# Patient Record
Sex: Female | Born: 1975
Health system: Southern US, Community
[De-identification: ages and names within clinical notes are randomized; demographics above are authoritative.]

## PROBLEM LIST (undated history)

## (undated) DIAGNOSIS — G43909 Migraine, unspecified, not intractable, without status migrainosus: Secondary | ICD-10-CM

## (undated) DIAGNOSIS — G459 Transient cerebral ischemic attack, unspecified: Secondary | ICD-10-CM

## (undated) DIAGNOSIS — E785 Hyperlipidemia, unspecified: Secondary | ICD-10-CM

## (undated) DIAGNOSIS — G8929 Other chronic pain: Secondary | ICD-10-CM

## (undated) DIAGNOSIS — J449 Chronic obstructive pulmonary disease, unspecified: Secondary | ICD-10-CM

## (undated) DIAGNOSIS — K219 Gastro-esophageal reflux disease without esophagitis: Secondary | ICD-10-CM

## (undated) DIAGNOSIS — J310 Chronic rhinitis: Secondary | ICD-10-CM

## (undated) DIAGNOSIS — L309 Dermatitis, unspecified: Secondary | ICD-10-CM

## (undated) DIAGNOSIS — D649 Anemia, unspecified: Secondary | ICD-10-CM

## (undated) DIAGNOSIS — E049 Nontoxic goiter, unspecified: Secondary | ICD-10-CM

## (undated) HISTORY — DX: Gastro-esophageal reflux disease without esophagitis: K21.9

## (undated) HISTORY — DX: Anemia, unspecified: D64.9

## (undated) HISTORY — DX: Transient cerebral ischemic attack, unspecified: G45.9

## (undated) HISTORY — DX: Dermatitis, unspecified: L30.9

## (undated) HISTORY — DX: Chronic rhinitis: J31.0

## (undated) HISTORY — DX: Nontoxic goiter, unspecified: E04.9

## (undated) HISTORY — DX: Chronic obstructive pulmonary disease, unspecified: J44.9

## (undated) HISTORY — DX: Hyperlipidemia, unspecified: E78.5

## (undated) HISTORY — PX: TUBAL LIGATION: SHX77

---

## 1999-11-16 ENCOUNTER — Emergency Department (HOSPITAL_COMMUNITY): Admission: EM | Admit: 1999-11-16 | Discharge: 1999-11-16 | Payer: Self-pay | Admitting: Emergency Medicine

## 2000-02-05 ENCOUNTER — Emergency Department (HOSPITAL_COMMUNITY): Admission: EM | Admit: 2000-02-05 | Discharge: 2000-02-05 | Payer: Self-pay | Admitting: Emergency Medicine

## 2000-02-16 ENCOUNTER — Emergency Department (HOSPITAL_COMMUNITY): Admission: EM | Admit: 2000-02-16 | Discharge: 2000-02-17 | Payer: Self-pay | Admitting: Emergency Medicine

## 2000-02-17 ENCOUNTER — Encounter: Payer: Self-pay | Admitting: Emergency Medicine

## 2000-03-07 ENCOUNTER — Ambulatory Visit (HOSPITAL_COMMUNITY): Admission: EM | Admit: 2000-03-07 | Discharge: 2000-03-07 | Payer: Self-pay | Admitting: Emergency Medicine

## 2000-03-07 ENCOUNTER — Encounter (INDEPENDENT_AMBULATORY_CARE_PROVIDER_SITE_OTHER): Payer: Self-pay | Admitting: Specialist

## 2000-03-07 ENCOUNTER — Encounter: Payer: Self-pay | Admitting: Emergency Medicine

## 2000-06-18 ENCOUNTER — Encounter: Payer: Self-pay | Admitting: Emergency Medicine

## 2000-06-18 ENCOUNTER — Emergency Department (HOSPITAL_COMMUNITY): Admission: EM | Admit: 2000-06-18 | Discharge: 2000-06-18 | Payer: Self-pay | Admitting: Emergency Medicine

## 2001-02-04 ENCOUNTER — Emergency Department (HOSPITAL_COMMUNITY): Admission: EM | Admit: 2001-02-04 | Discharge: 2001-02-04 | Payer: Self-pay | Admitting: Emergency Medicine

## 2001-02-17 ENCOUNTER — Inpatient Hospital Stay (HOSPITAL_COMMUNITY): Admission: AD | Admit: 2001-02-17 | Discharge: 2001-02-17 | Payer: Self-pay | Admitting: Obstetrics & Gynecology

## 2001-03-20 ENCOUNTER — Emergency Department (HOSPITAL_COMMUNITY): Admission: EM | Admit: 2001-03-20 | Discharge: 2001-03-20 | Payer: Self-pay | Admitting: Emergency Medicine

## 2001-08-30 ENCOUNTER — Inpatient Hospital Stay (HOSPITAL_COMMUNITY): Admission: AD | Admit: 2001-08-30 | Discharge: 2001-08-30 | Payer: Self-pay | Admitting: Obstetrics and Gynecology

## 2001-08-31 ENCOUNTER — Encounter (INDEPENDENT_AMBULATORY_CARE_PROVIDER_SITE_OTHER): Payer: Self-pay | Admitting: Specialist

## 2001-08-31 ENCOUNTER — Inpatient Hospital Stay (HOSPITAL_COMMUNITY): Admission: AD | Admit: 2001-08-31 | Discharge: 2001-09-03 | Payer: Self-pay | Admitting: Obstetrics and Gynecology

## 2001-10-07 ENCOUNTER — Other Ambulatory Visit: Admission: RE | Admit: 2001-10-07 | Discharge: 2001-10-07 | Payer: Self-pay | Admitting: Obstetrics and Gynecology

## 2002-02-15 ENCOUNTER — Inpatient Hospital Stay (HOSPITAL_COMMUNITY): Admission: AD | Admit: 2002-02-15 | Discharge: 2002-02-15 | Payer: Self-pay | Admitting: *Deleted

## 2002-12-05 ENCOUNTER — Emergency Department (HOSPITAL_COMMUNITY): Admission: EM | Admit: 2002-12-05 | Discharge: 2002-12-05 | Payer: Self-pay | Admitting: Emergency Medicine

## 2003-05-03 ENCOUNTER — Emergency Department (HOSPITAL_COMMUNITY): Admission: EM | Admit: 2003-05-03 | Discharge: 2003-05-03 | Payer: Self-pay | Admitting: *Deleted

## 2003-08-04 ENCOUNTER — Emergency Department (HOSPITAL_COMMUNITY): Admission: EM | Admit: 2003-08-04 | Discharge: 2003-08-04 | Payer: Self-pay | Admitting: Emergency Medicine

## 2003-08-05 ENCOUNTER — Inpatient Hospital Stay (HOSPITAL_COMMUNITY): Admission: AD | Admit: 2003-08-05 | Discharge: 2003-08-05 | Payer: Self-pay | Admitting: Obstetrics & Gynecology

## 2003-08-10 ENCOUNTER — Encounter: Admission: RE | Admit: 2003-08-10 | Discharge: 2003-08-10 | Payer: Self-pay | Admitting: *Deleted

## 2003-08-19 ENCOUNTER — Encounter: Admission: RE | Admit: 2003-08-19 | Discharge: 2003-08-19 | Payer: Self-pay | Admitting: *Deleted

## 2003-08-30 ENCOUNTER — Inpatient Hospital Stay (HOSPITAL_COMMUNITY): Admission: AD | Admit: 2003-08-30 | Discharge: 2003-08-30 | Payer: Self-pay | Admitting: Obstetrics and Gynecology

## 2003-08-31 ENCOUNTER — Encounter: Admission: RE | Admit: 2003-08-31 | Discharge: 2003-08-31 | Payer: Self-pay | Admitting: *Deleted

## 2003-09-15 ENCOUNTER — Encounter: Admission: RE | Admit: 2003-09-15 | Discharge: 2003-09-15 | Payer: Self-pay | Admitting: *Deleted

## 2003-09-28 ENCOUNTER — Other Ambulatory Visit: Admission: RE | Admit: 2003-09-28 | Discharge: 2003-09-28 | Payer: Self-pay | Admitting: Obstetrics and Gynecology

## 2003-10-14 ENCOUNTER — Inpatient Hospital Stay (HOSPITAL_COMMUNITY): Admission: AD | Admit: 2003-10-14 | Discharge: 2003-10-15 | Payer: Self-pay | Admitting: *Deleted

## 2003-11-11 ENCOUNTER — Encounter: Admission: RE | Admit: 2003-11-11 | Discharge: 2003-11-11 | Payer: Self-pay | Admitting: Obstetrics and Gynecology

## 2003-11-12 ENCOUNTER — Inpatient Hospital Stay (HOSPITAL_COMMUNITY): Admission: AD | Admit: 2003-11-12 | Discharge: 2003-11-12 | Payer: Self-pay | Admitting: Obstetrics & Gynecology

## 2003-11-18 ENCOUNTER — Encounter: Admission: RE | Admit: 2003-11-18 | Discharge: 2003-11-18 | Payer: Self-pay | Admitting: Obstetrics and Gynecology

## 2003-11-24 ENCOUNTER — Inpatient Hospital Stay (HOSPITAL_COMMUNITY): Admission: AD | Admit: 2003-11-24 | Discharge: 2003-11-24 | Payer: Self-pay | Admitting: Obstetrics and Gynecology

## 2003-11-24 ENCOUNTER — Encounter: Admission: RE | Admit: 2003-11-24 | Discharge: 2003-11-24 | Payer: Self-pay | Admitting: *Deleted

## 2003-12-01 ENCOUNTER — Encounter: Admission: RE | Admit: 2003-12-01 | Discharge: 2003-12-01 | Payer: Self-pay | Admitting: Obstetrics and Gynecology

## 2003-12-07 ENCOUNTER — Inpatient Hospital Stay (HOSPITAL_COMMUNITY): Admission: RE | Admit: 2003-12-07 | Discharge: 2003-12-11 | Payer: Self-pay | Admitting: Obstetrics and Gynecology

## 2003-12-07 ENCOUNTER — Encounter (INDEPENDENT_AMBULATORY_CARE_PROVIDER_SITE_OTHER): Payer: Self-pay | Admitting: Specialist

## 2003-12-12 ENCOUNTER — Encounter: Admission: RE | Admit: 2003-12-12 | Discharge: 2004-01-11 | Payer: Self-pay | Admitting: Obstetrics and Gynecology

## 2003-12-31 ENCOUNTER — Other Ambulatory Visit: Admission: RE | Admit: 2003-12-31 | Discharge: 2003-12-31 | Payer: Self-pay | Admitting: Obstetrics and Gynecology

## 2004-01-12 ENCOUNTER — Encounter: Admission: RE | Admit: 2004-01-12 | Discharge: 2004-02-11 | Payer: Self-pay | Admitting: Obstetrics and Gynecology

## 2004-03-13 ENCOUNTER — Encounter: Admission: RE | Admit: 2004-03-13 | Discharge: 2004-04-12 | Payer: Self-pay | Admitting: Obstetrics and Gynecology

## 2004-05-13 ENCOUNTER — Encounter: Admission: RE | Admit: 2004-05-13 | Discharge: 2004-06-12 | Payer: Self-pay | Admitting: Obstetrics and Gynecology

## 2004-06-13 ENCOUNTER — Encounter: Admission: RE | Admit: 2004-06-13 | Discharge: 2004-07-13 | Payer: Self-pay | Admitting: Obstetrics and Gynecology

## 2004-08-13 ENCOUNTER — Encounter: Admission: RE | Admit: 2004-08-13 | Discharge: 2004-09-12 | Payer: Self-pay | Admitting: Obstetrics and Gynecology

## 2005-04-15 ENCOUNTER — Emergency Department (HOSPITAL_COMMUNITY): Admission: EM | Admit: 2005-04-15 | Discharge: 2005-04-15 | Payer: Self-pay | Admitting: Emergency Medicine

## 2005-05-03 ENCOUNTER — Emergency Department (HOSPITAL_COMMUNITY): Admission: EM | Admit: 2005-05-03 | Discharge: 2005-05-03 | Payer: Self-pay | Admitting: Emergency Medicine

## 2005-07-07 ENCOUNTER — Emergency Department (HOSPITAL_COMMUNITY): Admission: EM | Admit: 2005-07-07 | Discharge: 2005-07-08 | Payer: Self-pay | Admitting: Emergency Medicine

## 2005-07-15 ENCOUNTER — Emergency Department (HOSPITAL_COMMUNITY): Admission: EM | Admit: 2005-07-15 | Discharge: 2005-07-15 | Payer: Self-pay | Admitting: Emergency Medicine

## 2005-11-10 ENCOUNTER — Emergency Department (HOSPITAL_COMMUNITY): Admission: EM | Admit: 2005-11-10 | Discharge: 2005-11-10 | Payer: Self-pay | Admitting: Emergency Medicine

## 2005-12-21 ENCOUNTER — Emergency Department (HOSPITAL_COMMUNITY): Admission: EM | Admit: 2005-12-21 | Discharge: 2005-12-21 | Payer: Self-pay | Admitting: Emergency Medicine

## 2006-04-05 ENCOUNTER — Emergency Department (HOSPITAL_COMMUNITY): Admission: EM | Admit: 2006-04-05 | Discharge: 2006-04-05 | Payer: Self-pay | Admitting: Emergency Medicine

## 2007-12-31 ENCOUNTER — Emergency Department (HOSPITAL_COMMUNITY): Admission: EM | Admit: 2007-12-31 | Discharge: 2007-12-31 | Payer: Self-pay | Admitting: Emergency Medicine

## 2008-02-02 ENCOUNTER — Ambulatory Visit: Payer: Self-pay | Admitting: Internal Medicine

## 2008-02-02 DIAGNOSIS — J45909 Unspecified asthma, uncomplicated: Secondary | ICD-10-CM

## 2008-02-02 DIAGNOSIS — J4530 Mild persistent asthma, uncomplicated: Secondary | ICD-10-CM | POA: Insufficient documentation

## 2008-03-12 ENCOUNTER — Telehealth (INDEPENDENT_AMBULATORY_CARE_PROVIDER_SITE_OTHER): Payer: Self-pay | Admitting: *Deleted

## 2008-03-16 ENCOUNTER — Telehealth: Payer: Self-pay | Admitting: Internal Medicine

## 2008-04-08 ENCOUNTER — Ambulatory Visit: Payer: Self-pay | Admitting: Internal Medicine

## 2008-04-08 DIAGNOSIS — J019 Acute sinusitis, unspecified: Secondary | ICD-10-CM | POA: Insufficient documentation

## 2008-04-09 ENCOUNTER — Telehealth (INDEPENDENT_AMBULATORY_CARE_PROVIDER_SITE_OTHER): Payer: Self-pay | Admitting: *Deleted

## 2008-04-13 ENCOUNTER — Telehealth (INDEPENDENT_AMBULATORY_CARE_PROVIDER_SITE_OTHER): Payer: Self-pay | Admitting: *Deleted

## 2008-04-23 ENCOUNTER — Telehealth (INDEPENDENT_AMBULATORY_CARE_PROVIDER_SITE_OTHER): Payer: Self-pay | Admitting: *Deleted

## 2008-04-29 ENCOUNTER — Ambulatory Visit: Payer: Self-pay | Admitting: Internal Medicine

## 2008-04-29 DIAGNOSIS — J310 Chronic rhinitis: Secondary | ICD-10-CM | POA: Insufficient documentation

## 2008-05-03 ENCOUNTER — Emergency Department (HOSPITAL_COMMUNITY): Admission: EM | Admit: 2008-05-03 | Discharge: 2008-05-03 | Payer: Self-pay | Admitting: Emergency Medicine

## 2008-05-03 ENCOUNTER — Encounter: Payer: Self-pay | Admitting: Internal Medicine

## 2008-05-03 LAB — CONVERTED CEMR LAB
AST: 24 units/L
Alkaline Phosphatase: 54 units/L
CO2: 24 meq/L
Neutrophils Relative %: 70 %
Platelets: 374 10*3/uL
RBC: 4.89 M/uL
RDW: 15.6 %
Total Bilirubin: 0.9 mg/dL

## 2008-05-27 ENCOUNTER — Encounter: Payer: Self-pay | Admitting: Internal Medicine

## 2008-05-28 ENCOUNTER — Emergency Department (HOSPITAL_COMMUNITY): Admission: EM | Admit: 2008-05-28 | Discharge: 2008-05-28 | Payer: Self-pay | Admitting: Emergency Medicine

## 2008-05-28 ENCOUNTER — Encounter: Payer: Self-pay | Admitting: Internal Medicine

## 2008-05-28 LAB — CONVERTED CEMR LAB
Albumin: 3.6 g/dL
Alkaline Phosphatase: 58 units/L
BUN: 9 mg/dL
Chloride: 103 meq/L
Creatinine, Ser: 0.72 mg/dL
Eosinophils Relative: 1 %
HCT: 36 %
RBC: 4.73 M/uL
RDW: 15.2 %
Total Bilirubin: 0.6 mg/dL
Total Protein: 7.2 g/dL

## 2008-06-02 ENCOUNTER — Ambulatory Visit: Payer: Self-pay | Admitting: Gastroenterology

## 2008-06-02 DIAGNOSIS — R1013 Epigastric pain: Secondary | ICD-10-CM | POA: Insufficient documentation

## 2008-06-02 DIAGNOSIS — D649 Anemia, unspecified: Secondary | ICD-10-CM | POA: Insufficient documentation

## 2008-06-02 DIAGNOSIS — J449 Chronic obstructive pulmonary disease, unspecified: Secondary | ICD-10-CM | POA: Insufficient documentation

## 2008-06-02 DIAGNOSIS — J4489 Other specified chronic obstructive pulmonary disease: Secondary | ICD-10-CM | POA: Insufficient documentation

## 2008-06-04 ENCOUNTER — Encounter: Payer: Self-pay | Admitting: Internal Medicine

## 2008-06-04 ENCOUNTER — Ambulatory Visit (HOSPITAL_COMMUNITY): Admission: RE | Admit: 2008-06-04 | Discharge: 2008-06-04 | Payer: Self-pay | Admitting: Gastroenterology

## 2008-06-21 ENCOUNTER — Ambulatory Visit: Payer: Self-pay | Admitting: Gastroenterology

## 2008-06-22 ENCOUNTER — Telehealth: Payer: Self-pay | Admitting: Gastroenterology

## 2008-06-24 ENCOUNTER — Telehealth: Payer: Self-pay | Admitting: Gastroenterology

## 2008-06-24 ENCOUNTER — Ambulatory Visit: Payer: Self-pay | Admitting: Cardiology

## 2008-06-28 ENCOUNTER — Encounter: Payer: Self-pay | Admitting: Gastroenterology

## 2008-06-28 ENCOUNTER — Telehealth: Payer: Self-pay | Admitting: Gastroenterology

## 2008-06-29 ENCOUNTER — Ambulatory Visit: Payer: Self-pay | Admitting: Gastroenterology

## 2008-06-29 LAB — CONVERTED CEMR LAB
Hemoglobin, Urine: NEGATIVE
RBC / HPF: NONE SEEN
Specific Gravity, Urine: 1.025 (ref 1.000–1.03)
Urobilinogen, UA: 0.2 (ref 0.0–1.0)

## 2008-07-01 ENCOUNTER — Encounter: Payer: Self-pay | Admitting: Gastroenterology

## 2008-07-01 ENCOUNTER — Ambulatory Visit: Payer: Self-pay | Admitting: Gastroenterology

## 2008-07-06 ENCOUNTER — Encounter: Payer: Self-pay | Admitting: Gastroenterology

## 2008-07-06 ENCOUNTER — Ambulatory Visit: Payer: Self-pay | Admitting: Internal Medicine

## 2008-07-06 ENCOUNTER — Telehealth (INDEPENDENT_AMBULATORY_CARE_PROVIDER_SITE_OTHER): Payer: Self-pay | Admitting: *Deleted

## 2008-08-13 ENCOUNTER — Telehealth (INDEPENDENT_AMBULATORY_CARE_PROVIDER_SITE_OTHER): Payer: Self-pay | Admitting: *Deleted

## 2009-01-25 IMAGING — CT CT PELVIS W/ CM
2 of 7 series · 16 of 46 positions shown, 18 images · IV contrast (Omnipaque 300)
Comparison: Ultrasound of the abdomen of 06/04/2008

CT ABDOMEN

CLINICAL DATA: Abdominal pain

CT ABDOMEN AND PELVIS WITH CONTRAST
TECHNIQUE: Multidetector CT imaging of the abdomen and pelvis was
performed using the standard protocol following bolus
administration of intravenous contrast.
Contrast: 100 ml Xmnipaque-F44

[Series 2: abd_pel 5.0 b30f st · axial · 0.70mm/px · z∈[-423,-63]mm · 13 of 85 slices shown, 15 images]
[im 7/85  soft-tissue]
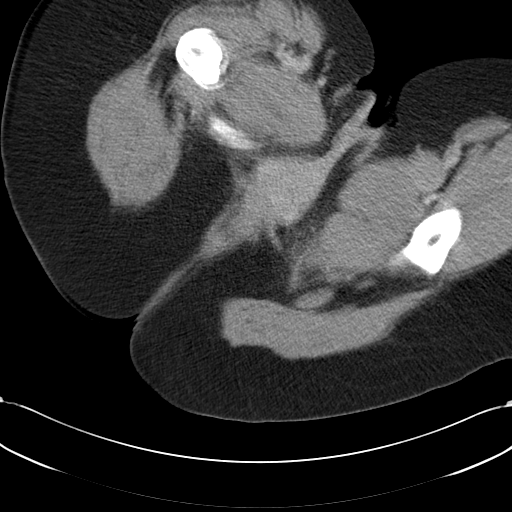
[im 7/85  bone]
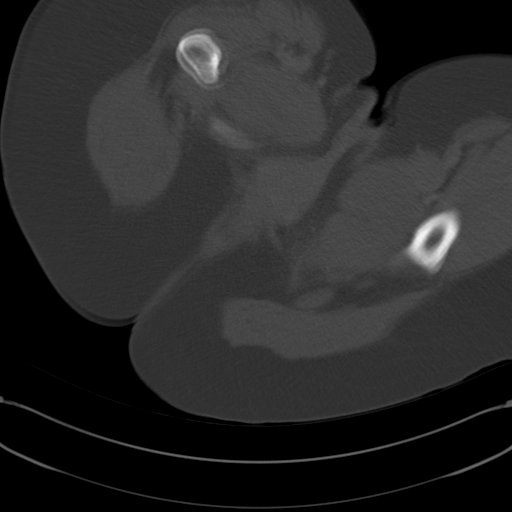
[im 13/85  soft-tissue]
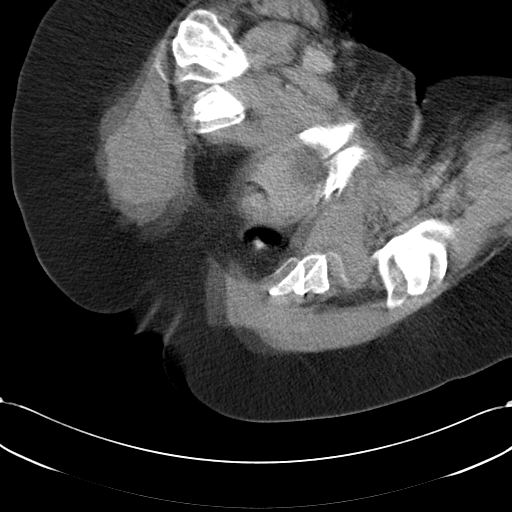
[im 19/85  soft-tissue]
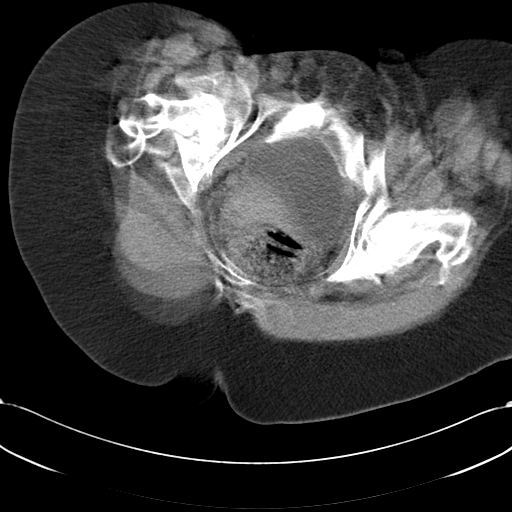
[im 25/85  soft-tissue]
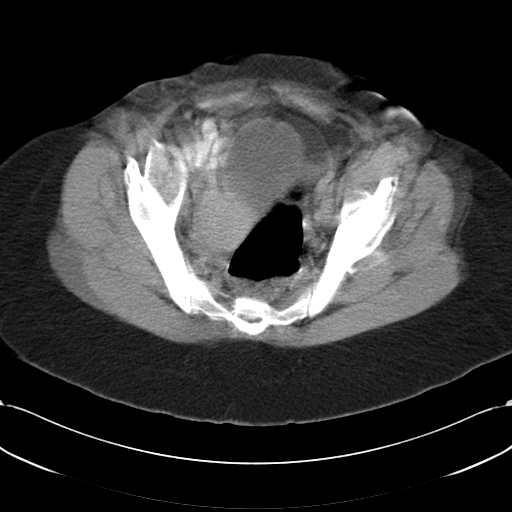
[im 31/85  soft-tissue]
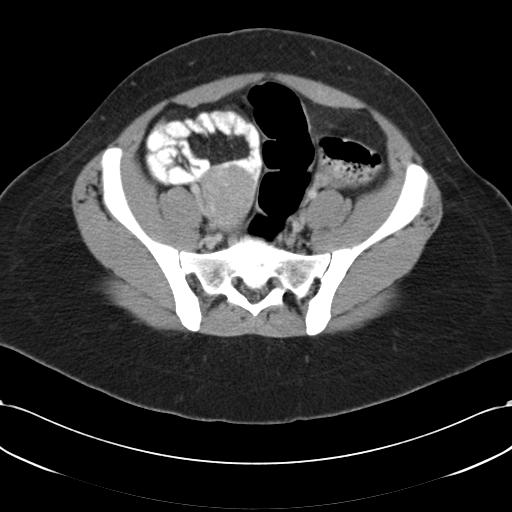
[im 37/85  soft-tissue]
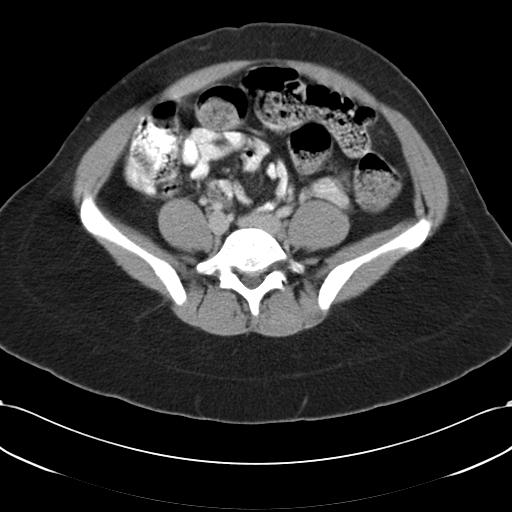
[im 43/85  soft-tissue]
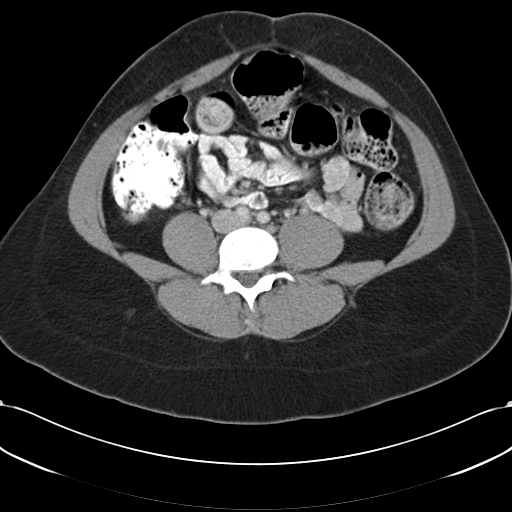
[im 49/85  soft-tissue]
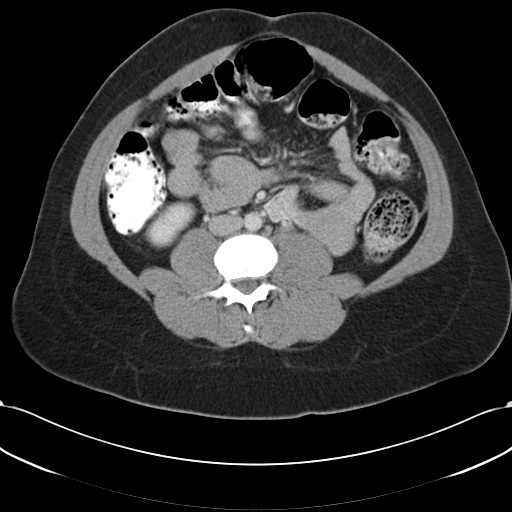
[im 55/85  soft-tissue]
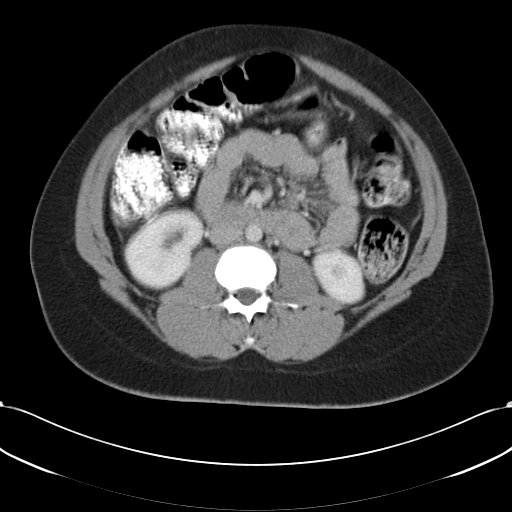
[im 55/85  bone]
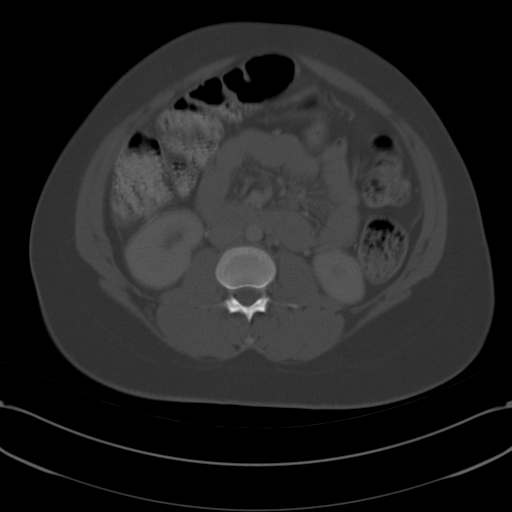
[im 61/85  soft-tissue]
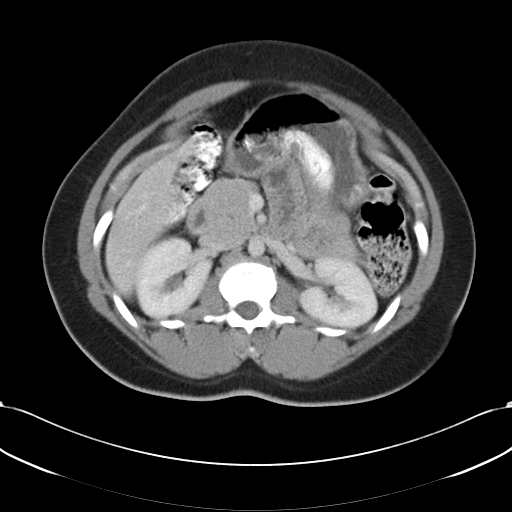
[im 67/85  soft-tissue]
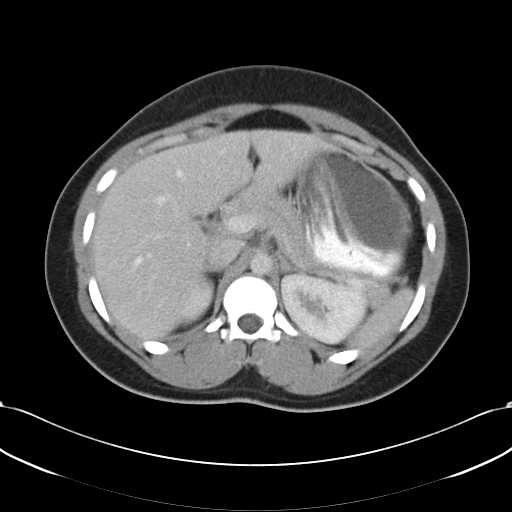
[im 73/85  soft-tissue]
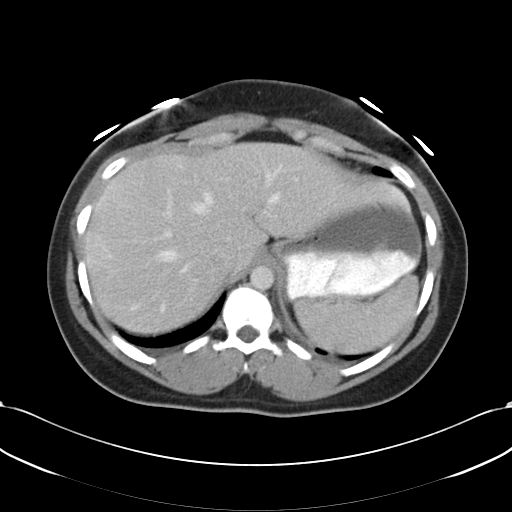
[im 79/85  soft-tissue]
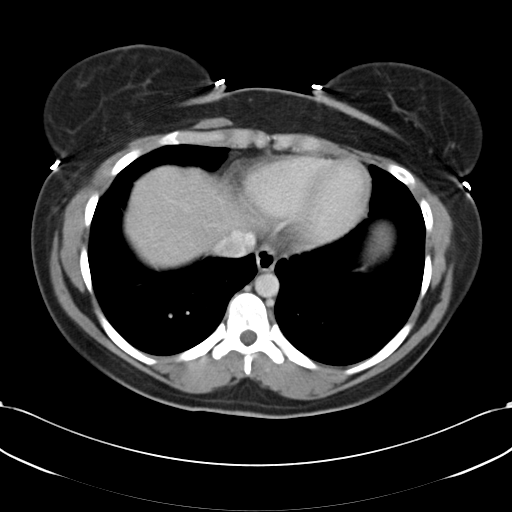

[Series 602: <mpr thick range> · coronal · 0.86mm/px · 3 of 89 slices shown]
[im 23/89  soft-tissue]
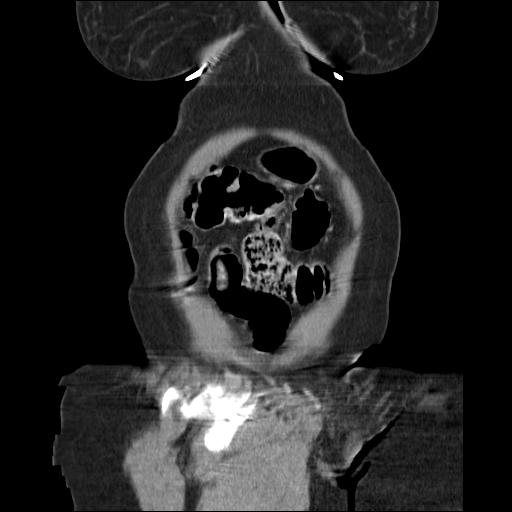
[im 45/89  soft-tissue]
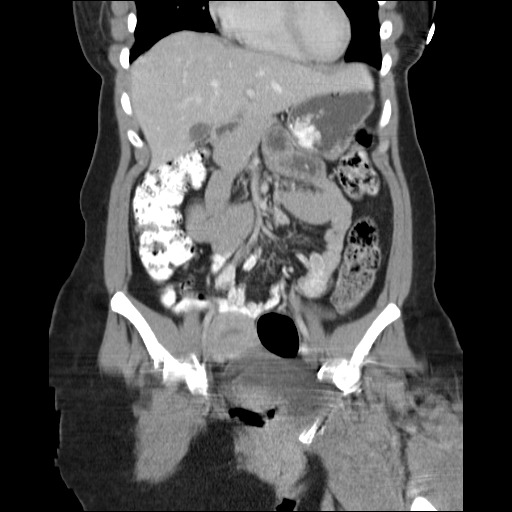
[im 67/89  soft-tissue]
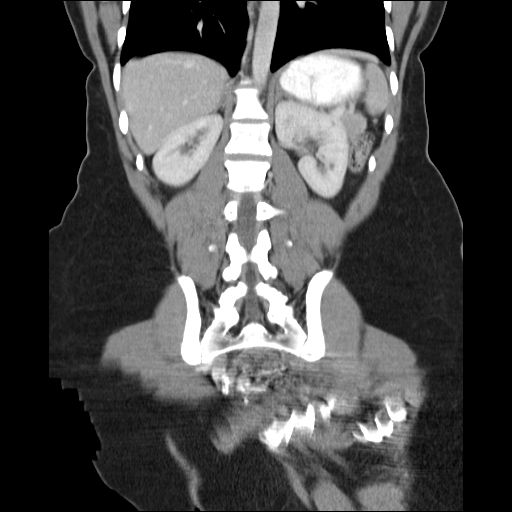

[16 of 46 positions shown; findings below may reference images not displayed]

FINDINGS: The lung bases are clear.  The liver enhances with no
focal abnormality and no ductal dilatation is seen.  No calcified
gallstones are noted.  The pancreas is within normal limits in size
with no mass and no ductal dilatation evident.  The adrenal glands
and spleen appear normal.  The kidneys enhance and on delayed
images the pelvocaliceal systems appear normal.  The abdominal
aorta is normal in caliber.  No adenopathy is seen.
IMPRESSION: No significant abnormality on CT of the abdomen.

CT PELVIS
FINDINGS: There is some free fluid within the right lower
quadrant.  Unfortunately the patient moved on the original portal
venous phase images and the pelvis is largely obscured by motion
artifact.  Delayed images through the pelvis were obtained.  There
is fluid within the right lower quadrant.  The appendix is well
seen and does fill with air.  There is no CT evidence of
appendicitis.  The terminal ileum is well visualized and appears
normal.  However, there do appear to be bilaterally collapsing
ovarian cysts present which may account for the fluid within the
right lower quadrant and  cul-de-sac.  The uterus is normal in
size.  The urinary bladder is unremarkable.  No colonic abnormality
is seen.  No bony abnormality is noted.
IMPRESSION: 1.  There is some free fluid within the right lower quadrant and
right adnexa probably due to  collapsing ovarian cysts as noted
above.  The appendix is filled with air and there is no CT evidence
currently of acute appendicitis.
2.  The terminal ileum is well visualized and appears normal.

## 2009-05-12 ENCOUNTER — Ambulatory Visit: Payer: Self-pay | Admitting: Internal Medicine

## 2009-06-14 ENCOUNTER — Emergency Department (HOSPITAL_COMMUNITY): Admission: EM | Admit: 2009-06-14 | Discharge: 2009-06-14 | Payer: Self-pay | Admitting: Emergency Medicine

## 2009-06-15 ENCOUNTER — Telehealth (INDEPENDENT_AMBULATORY_CARE_PROVIDER_SITE_OTHER): Payer: Self-pay | Admitting: *Deleted

## 2009-06-20 ENCOUNTER — Encounter: Payer: Self-pay | Admitting: Internal Medicine

## 2009-07-19 ENCOUNTER — Telehealth (INDEPENDENT_AMBULATORY_CARE_PROVIDER_SITE_OTHER): Payer: Self-pay | Admitting: *Deleted

## 2009-07-21 ENCOUNTER — Telehealth (INDEPENDENT_AMBULATORY_CARE_PROVIDER_SITE_OTHER): Payer: Self-pay | Admitting: *Deleted

## 2009-11-02 ENCOUNTER — Emergency Department (HOSPITAL_COMMUNITY): Admission: EM | Admit: 2009-11-02 | Discharge: 2009-11-02 | Payer: Self-pay | Admitting: Emergency Medicine

## 2010-01-25 ENCOUNTER — Emergency Department (HOSPITAL_COMMUNITY): Admission: EM | Admit: 2010-01-25 | Discharge: 2010-01-25 | Payer: Self-pay | Admitting: Emergency Medicine

## 2010-04-28 ENCOUNTER — Encounter: Payer: Self-pay | Admitting: Internal Medicine

## 2010-04-28 DIAGNOSIS — Z8672 Personal history of thrombophlebitis: Secondary | ICD-10-CM | POA: Insufficient documentation

## 2010-05-02 ENCOUNTER — Ambulatory Visit: Payer: Self-pay | Admitting: Internal Medicine

## 2010-05-02 DIAGNOSIS — E049 Nontoxic goiter, unspecified: Secondary | ICD-10-CM | POA: Insufficient documentation

## 2010-05-02 DIAGNOSIS — Z87891 Personal history of nicotine dependence: Secondary | ICD-10-CM | POA: Insufficient documentation

## 2010-05-02 DIAGNOSIS — G43909 Migraine, unspecified, not intractable, without status migrainosus: Secondary | ICD-10-CM | POA: Insufficient documentation

## 2010-05-02 DIAGNOSIS — M25569 Pain in unspecified knee: Secondary | ICD-10-CM | POA: Insufficient documentation

## 2010-05-02 DIAGNOSIS — K219 Gastro-esophageal reflux disease without esophagitis: Secondary | ICD-10-CM | POA: Insufficient documentation

## 2010-05-02 DIAGNOSIS — B359 Dermatophytosis, unspecified: Secondary | ICD-10-CM | POA: Insufficient documentation

## 2010-05-02 LAB — CONVERTED CEMR LAB
Alkaline Phosphatase: 64 units/L (ref 39–117)
Basophils Relative: 0.3 % (ref 0.0–3.0)
Bilirubin, Direct: 0.1 mg/dL (ref 0.0–0.3)
CO2: 28 meq/L (ref 19–32)
Calcium: 9.1 mg/dL (ref 8.4–10.5)
Creatinine, Ser: 0.7 mg/dL (ref 0.4–1.2)
Eosinophils Absolute: 0.4 10*3/uL (ref 0.0–0.7)
HDL: 58.5 mg/dL (ref >39.00)
LDL Cholesterol: 120 mg/dL — ABNORMAL HIGH (ref 0–99)
Lymphocytes Relative: 20.6 % (ref 12.0–46.0)
MCHC: 33.3 g/dL (ref 30.0–36.0)
Neutrophils Relative %: 70 % (ref 43.0–77.0)
Nitrite: NEGATIVE
Platelets: 413 10*3/uL — ABNORMAL HIGH (ref 150.0–400.0)
RBC: 4.4 M/uL (ref 3.87–5.11)
Specific Gravity, Urine: 1.025 (ref 1.000–1.030)
Total Bilirubin: 0.3 mg/dL (ref 0.3–1.2)
Total CHOL/HDL Ratio: 3
Total Protein, Urine: NEGATIVE mg/dL
Total Protein: 7.2 g/dL (ref 6.0–8.3)
Triglycerides: 109 mg/dL (ref 0.0–149.0)
Urine Glucose: NEGATIVE mg/dL
WBC: 10.3 10*3/uL (ref 4.5–10.5)
pH: 6 (ref 5.0–8.0)

## 2010-05-09 ENCOUNTER — Encounter: Admission: RE | Admit: 2010-05-09 | Discharge: 2010-05-09 | Payer: Self-pay | Admitting: Internal Medicine

## 2010-07-10 ENCOUNTER — Telehealth: Payer: Self-pay | Admitting: Internal Medicine

## 2010-08-17 ENCOUNTER — Ambulatory Visit: Payer: Self-pay | Admitting: Internal Medicine

## 2010-08-17 DIAGNOSIS — R002 Palpitations: Secondary | ICD-10-CM | POA: Insufficient documentation

## 2010-08-17 DIAGNOSIS — E785 Hyperlipidemia, unspecified: Secondary | ICD-10-CM | POA: Insufficient documentation

## 2010-08-17 LAB — CONVERTED CEMR LAB
Basophils Relative: 0.8 % (ref 0.0–3.0)
Eosinophils Relative: 4.7 % (ref 0.0–5.0)
Lymphocytes Relative: 17.8 % (ref 12.0–46.0)
Neutrophils Relative %: 69.6 % (ref 43.0–77.0)
Platelets: 378 10*3/uL (ref 150.0–400.0)
RBC: 4.57 M/uL (ref 3.87–5.11)
TSH: 0.67 microintl units/mL (ref 0.35–5.50)
WBC: 10.5 10*3/uL (ref 4.5–10.5)

## 2010-09-28 ENCOUNTER — Telehealth: Payer: Self-pay | Admitting: Internal Medicine

## 2010-10-17 NOTE — Assessment & Plan Note (Signed)
Summary: New/Bcbs/#/Cd   Vital Signs:  Patient profile:   35 year old female Height:      60 inches (152.40 cm) Weight:      184.4 pounds (83.82 kg) BMI:     36.14 O2 Sat:      98 % on Room air Temp:     97.7 degrees F (36.50 degrees C) oral Pulse rate:   69 / minute BP sitting:   100 / 82  (left arm) Cuff size:   regular  Vitals Entered By: Orlan Leavens RMA (May 02, 2010 1:48 PM)  O2 Flow:  Room air CC: New patient Is Patient Diabetic? No Pain Assessment Patient in pain? no        Primary Care Provider:  Newt Lukes MD  CC:  New patient.  History of Present Illness: new pt to me and our division, here to est care prev followed at urg care for [facility] needs, known to pulm div (wert)  patient is here today for annual physical. Patient feels well overall but numerous concerns:.   c/o knee pain affects bilateral knees at top of patella onset 3 months ago not assoc with swelling, redness or fever pain precipitated by climbing stairs no other joint pains pain improved when taking advil   c/o scalp itch - located 2 area on posterior crown - no scalp lesions or scabs - no new chemicals or topical products - itch improved when using excema cream but pt worried there is "infection under the skin" - +hair loss "becuase i pull it out to try to get to the bugs" also admits to sticking saftey pins into scalp "to try to kill the bugs" "i know i shouldn't do that - i just can't help myself" denies other rash - denies hx psyc illness or counseling -  asthma hx - reports compliance with ongoing medical treatment and no changes in medication dose or frequency. denies adverse side effects related to current therapy.   Preventive Screening-Counseling & Management  Alcohol-Tobacco     Alcohol drinks/day: <1     Alcohol Counseling: not indicated; use of alcohol is not excessive or problematic     Smoking Status: quit     Tobacco Counseling: not to resume use of tobacco  products  Caffeine-Diet-Exercise     Does Patient Exercise: yes     Exercise Counseling: to improve exercise regimen     Depression Counseling: not indicated; screening negative for depression  Safety-Violence-Falls     Seat Belt Counseling: not indicated; patient wears seat belts     Helmet Counseling: not indicated; patient wears helmet when riding bicycle/motocycle     Firearms in the Home: no firearms in the home     Firearm Counseling: not applicable     Violence in the Home: no risk noted     Fall Risk Counseling: not indicated; no significant falls noted  Clinical Review Panels:  Prevention   Last Pap Smear:  Interpretation/ Result:Negative for intraepithelial Lesion or Malignancy.    (09/18/2007)   Last Colonoscopy:  Location:  [facility].  (07/01/2008)  Immunizations   Last Tetanus Booster:  Historical (09/17/2009)  CBC   WBC:  13.3 (05/28/2008)   RBC:  4.73 (05/28/2008)   Hgb:  11.8 (05/28/2008)   Hct:  36.0 (05/28/2008)   Platelets:  360 (05/28/2008)   MCV  76.2 (05/28/2008)   RDW  15.2 (05/28/2008)   PMN:  90 (05/28/2008)   Monos:  3 (05/28/2008)   Eosinophils:  1 (05/28/2008)   Basophil:  0 (05/28/2008)  Complete Metabolic Panel   Glucose:  97 (05/28/2008)   Sodium:  134 (05/28/2008)   Potassium:  3.9 (05/28/2008)   Chloride:  103 (05/28/2008)   CO2:  25 (05/28/2008)   BUN:  9 (05/28/2008)   Creatinine:  0.72 (05/28/2008)   Albumin:  3.6 (05/28/2008)   Total Protein:  7.2 (05/28/2008)   Calcium:  9.3 (05/28/2008)   Total Bili:  0.6 (05/28/2008)   Alk Phos:  58 (05/28/2008)   SGPT (ALT):  18 (05/28/2008)   SGOT (AST):  58 (05/28/2008)   Current Medications (verified): 1)  Ventolin Hfa 108 (90 Base) Mcg/act  Aers (Albuterol Sulfate) .... 2 Puffs Every 4 Hours As Needed 2)  Symbicort 80-4.5 Mcg/act  Aero (Budesonide-Formoterol Fumarate) .... 2 Puffs First Thing  in Am and 2 Puffs Again in Pm About 12 Hours Later  Allergies  (verified): 1)  ! * Contrast Dye  Past History:  Past Medical History: ASTHMA (ICD-493.90)     - HFA 75% May 13, 2009  Anemia-NOS GERD excema  MD roster: pulm - wert gyn - prev phys for women  Past Surgical History: C-Section x 2 Tubal Ligation   Family History: Mother has allergies and asthma, thyroid- Maternal grandmother died of lung ca No FH of Colon Cancer Family History Diabetes 1st degree relative (mom, grandparent, other relative) Family History Hypertension (parent,grandparent, other relative)  Social History: Quit smoking Oct 2008. 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 I have reviewed all other systems and they were negative.   Physical Exam  General:  overweight-appearing.  alert, well-developed, well-nourished, and cooperative to examination.    Eyes:  vision grossly intact; pupils equal, round and reactive to light.  conjunctiva and lids normal.    Ears:  +large skin tag on posterior right pinna; otherwise normal pinnae bilaterally, without erythema, swelling, or tenderness to palpation. TMs clear, without effusion, or cerumen impaction. Hearing grossly normal bilaterally  Mouth:  teeth and gums in good repair; mucous membranes moist, without lesions or ulcers. oropharynx clear without exudate, no erythema.  Neck:  R>L thyromegaly; supple, full ROM, no masses; no thyroid nodules or tenderness. no JVD or carotid bruits.   Lungs:  normal respiratory effort, no intercostal retractions or use of accessory muscles; normal breath sounds bilaterally - no crackles and no wheezes.    Heart:  normal rate, regular rhythm, no murmur, and no rub. BLE without edema. Abdomen:  soft, non-tender, normal bowel sounds, no distention; no masses and no appreciable hepatomegaly or splenomegaly.   Msk:  No  deformity or scoliosis noted of thoracic or lumbar spine.  bilateral knees: full range of motion, no joint effusion or swelling. no erythema or abnormal warmth. Stable to ligamentous testing. Nontender to palpation over joint line but tender along quad insertion. Neurovascularly intact.  Neurologic:  alert & oriented X3 and cranial nerves II-XII symetrically intact.  strength normal in all extremities, sensation intact to light touch, and gait normal. speech fluent without dysarthria or aphasia; follows commands with good comprehension.  Skin:  no rashes, vesicles, ulcers, or erythema. No nodules or irregularity to palpation.  scalp w/o inflammation, alopecia, ulceration or tenderness -  Psych:  Oriented X3, memory intact for recent and remote, normally interactive, good eye  contact, min anxious appearing, not depressed appearing, and not agitated.   appears to have poor judgment when discussing scalp "bugs" (+delusion)   Impression & Recommendations:  Problem # 1:  PREVENTIVE HEALTH CARE (ICD-V70.0) Patient has been counseled on age-appropriate routine health concerns for screening and prevention. These are reviewed and up-to-date. Immunizations are up-to-date or declined. Labs ordered and will be reviewed. Pt to arrange for f/u at gyn for PAP/pelvic Orders: TLB-Lipid Panel (80061-LIPID) TLB-BMP (Basic Metabolic Panel-BMET) (80048-METABOL) TLB-CBC Platelet - w/Differential (85025-CBCD) TLB-Hepatic/Liver Function Pnl (80076-HEPATIC) TLB-TSH (Thyroid Stimulating Hormone) (84443-TSH) TLB-Udip w/ Micro (81001-URINE)  Problem # 2:  DERMATOPHYTOSIS OF UNSPECIFIED SITE (ICD-110.9)  unsure that there is underlying infx as percieved by pt -  reassured pt of same but pt would like to consider bx to prove this - refer to derm also discussed symptoms as manifestation of OCD/delusional disorder -  pt not against psyc dx and tx for same "but i just want it checked out all the way 1st, my family thinks i'm  crazy too" instructed to NOT place saftey pins into scalp - pt agrees  Orders: Dermatology Referral (Derma)  Problem # 3:  GOITER, UNSPECIFIED (ICD-240.9) incidental on exam - labs with cpx abnd refer for [place] to r/o nodule f/u 2-3 mo Orders: Radiology Referral (Radiology)  Problem # 4:  KNEE PAIN, BILATERAL (ICD-719.46)  suscpet tendonitis - tx with meloxicam as needed (relief with advil noted but duration of relief too short) Her updated medication list for this problem includes:    Meloxicam 15 Mg Tabs (Meloxicam) .Marland Kitchen... 1 by mouth once daily as needed for knee pain  Orders: Prescription Created Electronically [phone])  Complete Medication List: 1)  Ventolin Hfa 108 (90 Base) Mcg/act Aers (Albuterol sulfate) .... 2 puffs every 4 hours as needed 2)  Symbicort 80-4.5 Mcg/act Aero (Budesonide-formoterol fumarate) .... 2 puffs first thing  in am and 2 puffs again in pm about 12 hours later 3)  Meloxicam 15 Mg Tabs (Meloxicam) .Marland Kitchen.. 1 by mouth once daily as needed for knee pain  Patient Instructions: 1)  it was good to see you today.  2)  test(s) ordered today - your results will be posted on the phone tree for review in 48-72 hours from the time of test completion; call [phone] and enter your 9 digit MRN (listed above on this page, just below your name); if any changes need to be made or there are abnormal results, you will be contacted directly. 3)  we'll make referral to dermatology for your scalp and ear. Our office will contact you regarding this appointment once made.  4)  also we'll make referral for neck ultrasound to look at thyroid enlargment. Our office will contact you regarding this appointment once made.  5)  use meloxicam for your knee pain as discussed - your prescription has been electronically submitted to your pharmacy. Please take as directed. Contact our office if you believe you're having problems with the medication(s).  6)  You need to have a Pap Smear to  prevent cervical cancer. schedule this appointment with [facility] or other gynecologist as discussed 7)  Please schedule a follow-up appointment in 2-3 months to review ultrasound, call sooner if problems.  Prescriptions: MELOXICAM 15 MG TABS (MELOXICAM) 1 by mouth once daily as needed for knee pain  #30 x 1   Entered and Authorized by:   Newt Lukes MD   Signed by:   Newt Lukes MD on 05/02/2010  Method used:   Electronically to        [facility] (retail)       [address]       [place], [place]  [address]       Ph: [phone]       Fax: [phone]   RxID:   [id number]     Immunization History:  Tetanus/Td Immunization History:    Tetanus/Td:  historical (09/17/2009)    Pap Smear  Procedure date:  09/18/2007  Findings:      Interpretation/ Result:Negative for intraepithelial Lesion or Malignancy.

## 2010-10-17 NOTE — Progress Notes (Signed)
Summary: refill  Phone Note Call from Patient Call back at Home Phone [phone]   Caller: Patient Call For: wert Reason for Call: Refill Medication Summary of Call: Need refill for symbicort 80-4.23mcg, pt has appt on 11/23.//[email or web address] Initial call taken by: Darletta Moll,  July 10, 2010 3:49 PM  Follow-up for Phone Call        Pt verified medication request and pharmacy. Zackery Barefoot CMA  July 10, 2010 3:53 PM     Prescriptions: SYMBICORT 80-4.5 MCG/ACT  AERO (BUDESONIDE-FORMOTEROL FUMARATE) 2 puffs first thing  in am and 2 puffs again in pm about 12 hours later  #1 x 3   Entered by:   Zackery Barefoot CMA   Authorized by:   Nyoka Cowden MD   Signed by:   Zackery Barefoot CMA on 07/10/2010   Method used:   Electronically to        [facility] [address] (retail)       [phone]       [place], [place]  [address]       Ph: [phone]       Fax: [phone]   RxID:   [id number]

## 2010-10-17 NOTE — Assessment & Plan Note (Signed)
Summary: on/off headache/fluttery feeling base of neck/cd   Vital Signs:  Patient profile:   35 year old female Height:      60 inches (152.40 cm) Weight:      186.8 pounds (84.91 kg) O2 Sat:      98 % on Room air Temp:     97.6 degrees F (36.44 degrees C) oral Pulse rate:   79 / minute BP sitting:   102 / 70  (left arm) Cuff size:   regular  Vitals Entered By: Orlan Leavens RMA (August 17, 2010 10:42 AM)  O2 Flow:  Room air CC: Headaches/ Fluttering feeling in base of neck Is Patient Diabetic? No Pain Assessment Patient in pain? no        Primary Care Provider:  Newt Lukes MD  CC:  Headaches/ Fluttering feeling in base of neck.  History of Present Illness: c/o HA, mild onset 3 days ago assoc with fluttering in neck/top of anterior chest - SOB not responsive to inc Ventolin use no cough, no weight change, no sputum or fever - no CP or leg swelling no weakness or vision change no trauma,  +sick contacts no change in meds (except inc alb mdi use)  reviewed chronic med issues c/o scalp itch - located 2 areas on posterior crown - no scalp lesions or scabs - no new chemicals or topical products - itch improved when using excema cream but pt worried there is "infection under the skin" - +hair loss "becuase i pull it out to try to get to the bugs" also admits to sticking saftey pins into scalp "to try to kill the bugs" "i know i shouldn't do that - i just can't help myself" denies other rash - denies hx psyc illness or counseling -  asthma hx - reports compliance with ongoing medical treatment and no changes in medication dose or frequency. denies adverse side effects related to current therapy.   Clinical Review Panels:  Lipid Management   Cholesterol:  200 (05/02/2010)   LDL (bad choesterol):  120 (05/02/2010)   HDL (good cholesterol):  58.50 (05/02/2010)  CBC   WBC:  10.3 (05/02/2010)   RBC:  4.40 (05/02/2010)   Hgb:  11.3 (05/02/2010)   Hct:  33.8  (05/02/2010)   Platelets:  413.0 (05/02/2010)   MCV  76.7 (05/02/2010)   MCHC  33.3 (05/02/2010)   RDW  16.0 (05/02/2010)   PMN:  70.0 (05/02/2010)   Lymphs:  20.6 (05/02/2010)   Monos:  5.6 (05/02/2010)   Eosinophils:  3.5 (05/02/2010)   Basophil:  0.3 (05/02/2010)  Complete Metabolic Panel   Glucose:  83 (05/02/2010)   Sodium:  139 (05/02/2010)   Potassium:  3.8 (05/02/2010)   Chloride:  104 (05/02/2010)   CO2:  28 (05/02/2010)   BUN:  9 (05/02/2010)   Creatinine:  0.7 (05/02/2010)   Albumin:  3.5 (05/02/2010)   Total Protein:  7.2 (05/02/2010)   Calcium:  9.1 (05/02/2010)   Total Bili:  0.3 (05/02/2010)   Alk Phos:  64 (05/02/2010)   SGPT (ALT):  19 (05/02/2010)   SGOT (AST):  18 (05/02/2010)   Current Medications (verified): 1)  Ventolin Hfa 108 (90 Base) Mcg/act  Aers (Albuterol Sulfate) .... 2 Puffs Every 4 Hours As Needed 2)  Symbicort 80-4.5 Mcg/act  Aero (Budesonide-Formoterol Fumarate) .... 2 Puffs First Thing  in Am and 2 Puffs Again in Pm About 12 Hours Later  Allergies (verified): 1)  ! * Contrast Dye  Past History:  Past Medical History: ASTHMA (ICD-493.90)     - HFA 75% May 13, 2009  Anemia-NOS GERD excema  MD roster:  pulm - wert gyn - prev phys for women  Review of Systems  The patient denies fever, weight loss, and syncope.         c/o "sticking feeling in throat with solid food but no regurgitation, +GERD symptoms, mild  Physical Exam  General:  overweight-appearing, nontoxic.  alert, well-developed, well-nourished, and cooperative to examination.  spouse at side  Head:  Normocephalic and atraumatic without obvious abnormalities. No apparent alopecia or balding. Eyes:  vision grossly intact; pupils equal, round and reactive to light.  conjunctiva and lids normal.    Ears:  +large skin tag on posterior right pinna; otherwise normal pinnae bilaterally, without erythema, swelling, or tenderness to palpation. TMs clear, without effusion, or  cerumen impaction. Hearing grossly normal bilaterally  Mouth:  teeth and gums in good repair; mucous membranes moist, without lesions or ulcers. oropharynx clear without exudate, no erythema.  Neck:  R>L thyromegaly; supple, full ROM, no masses; no thyroid nodules or tenderness. no JVD or carotid bruits.   Lungs:  normal respiratory effort, no intercostal retractions or use of accessory muscles; normal breath sounds bilaterally - no crackles and no wheezes.    Heart:  normal rate, regular rhythm, no murmur, and no rub. BLE without edema. Neurologic:  alert & oriented X3 and cranial nerves II-XII symetrically intact.  strength normal in all extremities, sensation intact to light touch, and gait normal. speech fluent without dysarthria or aphasia; follows commands with good comprehension.    Impression & Recommendations:  Problem # 1:  PALPITATIONS (ICD-785.1)  suspect flutter chest symptoms related to inc Ventolin use - exam benign r/o anema or thyroid dz  with labs rec not to use Alb unless +wheeze, call if symptoms worse Orders: TLB-CBC Platelet - w/Differential (85025-CBCD) TLB-TSH (Thyroid Stimulating Hormone) (84443-TSH)  Also avoid caffeine, chocolate, and stimulants. Stress reduction discussed.   Problem # 2:  GERD (ICD-530.81)  suspect mild esophagitis with sticking sensation- 1 mo PPI trial - call if symptoms worse Her updated medication list for this problem includes:    Omeprazole 20 Mg Cpdr (Omeprazole) .Marland Kitchen... 1 by mouth once daily x 1 month  Orders: Prescription Created Electronically [phone])  Problem # 3:  MIGRAINE HEADACHE (ICD-346.90) current HA symptoms/neuro exam benign - suspect related to med use -  The following medications were removed from the medication list:    Meloxicam 15 Mg Tabs (Meloxicam) .Marland Kitchen... 1 by mouth once daily as needed for knee pain  Complete Medication List: 1)  Ventolin Hfa 108 (90 Base) Mcg/act Aers (Albuterol sulfate) .... 2 puffs every 4  hours as needed 2)  Symbicort 80-4.5 Mcg/act Aero (Budesonide-formoterol fumarate) .... 2 puffs first thing  in am and 2 puffs again in pm about 12 hours later 3)  Omeprazole 20 Mg Cpdr (Omeprazole) .Marland Kitchen.. 1 by mouth once daily x 1 month  Patient Instructions: 1)  it was good to see you today. 2)  use the ventolin only if wheezing, not just shortness of breath because ventolin can cause fluttering 3)  test(s) ordered today - your results will be posted on the phone tree for review in 48-72 hours from the time of test completion; call [phone] and enter your 9 digit MRN (listed above on this page, just below your name); if any changes need to be made or there are abnormal results, you will be contacted directly.  4)  use ompeprazole daily x 1 month for throat feeling and reflux - your prescription has been electronically submitted to your pharmacy. Please take as directed. Contact our office if you believe you're having problems with the medication(s).  5)  Please schedule a follow-up appointment in March 2012 to recheck cholesterol, call sooner if problems.  Prescriptions: OMEPRAZOLE 20 MG CPDR (OMEPRAZOLE) 1 by mouth once daily x 1 month  #30 x 0   Entered and Authorized by:   Newt Lukes MD   Signed by:   Newt Lukes MD on 08/17/2010   Method used:   Electronically to        [facility] [address] (retail)       [phone]       [place], [place]  [address]       Ph: [phone]       Fax: [phone]   RxID:   [id number]    Orders Added: 1)  TLB-CBC Platelet - w/Differential [85025-CBCD] 2)  TLB-TSH (Thyroid Stimulating Hormone) [84443-TSH] 3)  Est. Patient Level IV [01027] 4)  Prescription Created Electronically [phone]

## 2010-10-19 NOTE — Progress Notes (Signed)
Summary: Rx req - sleep aid?  Phone Note Call from Patient Call back at Home Phone [phone]   Caller: Patient Summary of Call: Pt called stating she has been experiencing trouble falling asleep and staying asleep for a few days. Pt is requesting Rx for a mild sleep aid, please advise. Initial call taken by: Margaret Pyle, CMA,  September 28, 2010 8:59 AM  Follow-up for Phone Call        may call in ambein 5mg  at bedtime as needed, #20, no refills Follow-up by: Newt Lukes MD,  September 28, 2010 12:29 PM  Additional Follow-up for Phone Call Additional follow up Details #1::        Pt informed via VM Additional Follow-up by: Margaret Pyle, CMA,  September 28, 2010 1:42 PM    New/Updated Medications: AMBIEN 5 MG TABS (ZOLPIDEM TARTRATE) 1 by mouth at bedtime as needed for sleep Prescriptions: AMBIEN 5 MG TABS (ZOLPIDEM TARTRATE) 1 by mouth at bedtime as needed for sleep  #20 x 0   Entered and Authorized by:   Margaret Pyle, CMA   Signed by:   Margaret Pyle, CMA on 09/28/2010   Method used:   Telephoned to ...       [facility] [address] (retail)       [phone]       [place], [place]  [address]       Ph: [phone]       Fax: [phone]   RxID:   [id number] AMBIEN 5 MG TABS (ZOLPIDEM TARTRATE) 1 by mouth at bedtime as needed for sleep  #20 x 0   Entered and Authorized by:   Newt Lukes MD   Signed by:   Newt Lukes MD on 09/28/2010   Method used:   Historical   RxID:   [id number]

## 2010-11-08 ENCOUNTER — Telehealth: Payer: Self-pay | Admitting: Internal Medicine

## 2010-11-14 NOTE — Progress Notes (Signed)
Summary: refill  Phone Note Call from Patient Call back at Home Phone [phone]   Caller: Patient Call For: Debbie Sexton Reason for Call: Refill Medication Summary of Call: Pt requests samples of symbicort and refill on ventolin hfa 113mcg.//[facility] [address] Initial call taken by: Darletta Moll,  November 08, 2010 8:26 AM  Follow-up for Phone Call        called and lmom for pt to make her aware that the 2 meds were sent to her pharmacy with no refills---pt has a pending appt in march with MW.  explained to pt in vm that no refills were given since she has not been here in over a year. pt is to call for any problems or questions Randell Loop CMA  November 08, 2010 8:54 AM      Prescriptions: SYMBICORT 80-4.5 MCG/ACT  AERO (BUDESONIDE-FORMOTEROL FUMARATE) 2 puffs first thing  in am and 2 puffs again in pm about 12 hours later  #1 x 0   Entered by:   Randell Loop CMA   Authorized by:   Nyoka Cowden MD   Signed by:   Randell Loop CMA on 11/08/2010   Method used:   Electronically to        [facility] [address] (retail)       [phone]       [place], [place]  [address]       Ph: [phone]       Fax: [phone]   RxID:   [id number] VENTOLIN HFA 108 (90 BASE) MCG/ACT  AERS (ALBUTEROL SULFATE) 2 puffs every 4 hours as needed  #1 x 0   Entered by:   Randell Loop CMA   Authorized by:   Nyoka Cowden MD   Signed by:   Randell Loop CMA on 11/08/2010   Method used:   Electronically to        [facility] [address] (retail)       [phone]       [place], [place]  [address]       Ph: [phone]       Fax: [phone]   RxID:   [id number]

## 2010-11-16 DIAGNOSIS — G459 Transient cerebral ischemic attack, unspecified: Secondary | ICD-10-CM

## 2010-11-16 HISTORY — DX: Transient cerebral ischemic attack, unspecified: G45.9

## 2010-11-24 ENCOUNTER — Telehealth: Payer: Self-pay | Admitting: Internal Medicine

## 2010-11-24 ENCOUNTER — Encounter: Payer: Self-pay | Admitting: Internal Medicine

## 2010-12-05 ENCOUNTER — Ambulatory Visit: Payer: Self-pay | Admitting: Internal Medicine

## 2010-12-05 NOTE — Progress Notes (Signed)
Summary: sinus pressure/ pain > otcs pending ov  Phone Note Call from Patient Call back at [facility] Phone [phone]   Caller: Patient Call For: Ruthetta Koopmann Summary of Call: pt s/o sinus congestion w/ pressure in face. wants recs re: OTC/ nasal spray to use. pt has an appt sched w/ dr Sherene Sires 3/20.Marland Kitchen [facility] [address] Initial call taken by: Tivis Ringer, CNA,  November 24, 2010 9:41 AM  Follow-up for Phone Call        Pt c/o facial pain, with headaches starting this morning. Took a Benadryl this morning and vomit it back up with clear fluid. Denies facial swelling, fever, bodyaches. Pt last seen 05/12/2009 and is scheduled to see MW 12/05/10. Pt has not been using Symbicort x 1 month due to cost. Please advise. Thanks Zackery Barefoot CMA  November 24, 2010 11:00 AM  advil cold and sinus (behind the counter ) and afrin nasal spray as needed  Follow-up by: Nyoka Cowden MD,  November 24, 2010 1:04 PM  Additional Follow-up for Phone Call Additional follow up Details #1::        lmomtcb x 1 Zackery Barefoot [facility]  November 24, 2010 1:49 PM     Additional Follow-up for Phone Call Additional follow up Details #2::    Pt aware of recs.Carron Curie CMA  November 27, 2010 11:17 AM   New/Updated Medications: SYMBICORT 80-4.5 MCG/ACT  AERO (BUDESONIDE-FORMOTEROL FUMARATE) ******NOT TAKING*****2 puffs first thing  in am and 2 puffs again in pm about 12 hours later

## 2010-12-08 ENCOUNTER — Emergency Department (HOSPITAL_COMMUNITY): Payer: Managed Care, Other (non HMO)

## 2010-12-08 ENCOUNTER — Emergency Department (HOSPITAL_COMMUNITY)
Admission: EM | Admit: 2010-12-08 | Discharge: 2010-12-08 | Disposition: A | Discharge status: Home / Self Care | Payer: Managed Care, Other (non HMO) | Attending: Emergency Medicine | Admitting: Emergency Medicine

## 2010-12-08 DIAGNOSIS — F29 Unspecified psychosis not due to a substance or known physiological condition: Secondary | ICD-10-CM | POA: Insufficient documentation

## 2010-12-08 DIAGNOSIS — F411 Generalized anxiety disorder: Secondary | ICD-10-CM | POA: Insufficient documentation

## 2010-12-08 LAB — BASIC METABOLIC PANEL
BUN: 9 mg/dL (ref 6–23)
CO2: 25 mEq/L (ref 19–32)
Calcium: 9.4 mg/dL (ref 8.4–10.5)
Creatinine, Ser: 0.68 mg/dL (ref 0.4–1.2)
Glucose, Bld: 118 mg/dL — ABNORMAL HIGH (ref 70–99)

## 2010-12-21 ENCOUNTER — Encounter: Payer: Self-pay | Admitting: Internal Medicine

## 2010-12-22 LAB — D-DIMER, QUANTITATIVE: D-Dimer, Quant: <0.22 ug/mL-FEU (ref 0.00–0.48)

## 2010-12-26 ENCOUNTER — Encounter: Payer: Self-pay | Admitting: Internal Medicine

## 2010-12-26 ENCOUNTER — Ambulatory Visit: Payer: Self-pay | Admitting: Internal Medicine

## 2010-12-26 NOTE — Progress Notes (Unsigned)
  Subjective:    Patient ID: Debbie Sexton, female    DOB: [birth year], 35 y.o.   MRN: [id number]  HPI    Review of Systems     Objective:   Physical Exam        Assessment & Plan:

## 2011-01-12 ENCOUNTER — Emergency Department (HOSPITAL_COMMUNITY): Payer: Managed Care, Other (non HMO)

## 2011-01-12 ENCOUNTER — Emergency Department (HOSPITAL_COMMUNITY)
Admission: EM | Admit: 2011-01-12 | Discharge: 2011-01-12 | Disposition: A | Discharge status: Home / Self Care | Payer: Managed Care, Other (non HMO) | Attending: Emergency Medicine | Admitting: Emergency Medicine

## 2011-01-12 DIAGNOSIS — R630 Anorexia: Secondary | ICD-10-CM | POA: Insufficient documentation

## 2011-01-12 DIAGNOSIS — R42 Dizziness and giddiness: Secondary | ICD-10-CM | POA: Insufficient documentation

## 2011-01-12 DIAGNOSIS — J45909 Unspecified asthma, uncomplicated: Secondary | ICD-10-CM | POA: Insufficient documentation

## 2011-01-12 DIAGNOSIS — R110 Nausea: Secondary | ICD-10-CM | POA: Insufficient documentation

## 2011-01-12 DIAGNOSIS — R5381 Other malaise: Secondary | ICD-10-CM | POA: Insufficient documentation

## 2011-01-12 LAB — URINALYSIS, ROUTINE W REFLEX MICROSCOPIC
Bilirubin Urine: NEGATIVE
Hgb urine dipstick: NEGATIVE
Nitrite: NEGATIVE
Protein, ur: NEGATIVE mg/dL
Urobilinogen, UA: 1 mg/dL (ref 0.0–1.0)

## 2011-01-12 LAB — BASIC METABOLIC PANEL
CO2: 25 mEq/L (ref 19–32)
Calcium: 9 mg/dL (ref 8.4–10.5)
Creatinine, Ser: 0.66 mg/dL (ref 0.4–1.2)
GFR calc Af Amer: >60 mL/min (ref >60)
GFR calc non Af Amer: >60 mL/min (ref >60)
Glucose, Bld: 93 mg/dL (ref 70–99)

## 2011-01-12 LAB — CBC
HCT: 33.8 % — ABNORMAL LOW (ref 36.0–46.0)
MCHC: 32.5 g/dL (ref 30.0–36.0)
MCV: 74.8 fL — ABNORMAL LOW (ref 78.0–100.0)
Platelets: 404 10*3/uL — ABNORMAL HIGH (ref 150–400)
RDW: 14.8 % (ref 11.5–15.5)
WBC: 11.4 10*3/uL — ABNORMAL HIGH (ref 4.0–10.5)

## 2011-01-12 LAB — DIFFERENTIAL
Basophils Absolute: 0 10*3/uL (ref 0.0–0.1)
Eosinophils Absolute: 0.5 10*3/uL (ref 0.0–0.7)
Eosinophils Relative: 4 % (ref 0–5)
Lymphocytes Relative: 23 % (ref 12–46)
Monocytes Absolute: 0.8 10*3/uL (ref 0.1–1.0)

## 2011-01-12 LAB — GLUCOSE, CAPILLARY: Glucose-Capillary: 95 mg/dL (ref 70–99)

## 2011-01-30 NOTE — Assessment & Plan Note (Signed)
[facility]                            CARDIOLOGY OFFICE NOTE   [name], [name]                     MRN:          [id number]  DATE:06/24/2008                            DOB:          03/29/1976    HISTORY OF PRESENT ILLNESS:  Debbie Sexton is a 35 year old female who  was seen by Dr. Arlyce Dice for evaluation of abdominal pain.  She was seen  in the emergency room and the white count was 13,000.  She subsequently  was referred to the CT scan for an abdominal CT study.  She apparently  has a history of asthma.  During the injection of contrast at the [facility], the patient developed nausea and vomiting.  She also  developed some swelling over the right eye, historically, she has had  some swelling over the past few days, although swelling resolved.  She  had no increased shortness of breath, and she remained stable for  considerable period of time after the procedure.  Her nausea was  completely resolved.  She is currently not having abdominal pain.  Vital  signs have remained stable.  The swelling over the right eye has  resolved.  We have called Dr. Marzetta Board office to notify him of this  reaction.  He is going to be out, but they have made her an appointment.  She has been instructed should there be any problems to contact either  Dr. Arlyce Dice, or [facility] on [address] promptly.  At the current  time, there is no evidence of progressive or significantly progressive  contrast reaction.  Her lung fields are entirely clear without any type  of wheezing.   Her blood pressure is 106/70, and her vital signs are stable.  We have  encouraged her to get her right eye, which has had some swelling over  the past few days, intermittently to be looked at [facility] where  she gets her primary care.  Dr. Marzetta Board office will notify her of her  abdominal CT results.  She is encouraged to drink fluids.   The patient as noted, remained  stable throughout, laughing and talking  on her cell phone under direct observation by [place] in the office.     Arturo Morton. Riley Kill, MD, [facility]  Electronically Signed    TDS/MedQ  DD: 06/24/2008  DT: 06/25/2008  Job #: [id number]   cc:   Barbette Hair. Arlyce Dice, MD,FACG  Jonita Albee, M.D.

## 2011-02-02 NOTE — Discharge Summary (Signed)
[facility]  Patient:    Debbie Sexton, Debbie Sexton Visit Number: [id number] MRN: [id number]          Service Type: OBS Location: 910A 9135 01 Attending Physician:  Marcelle Overlie Dictated by:   Danie Chandler, R.N. Admit Date:  08/31/2001 Discharge Date: 09/03/2001                             Discharge Summary  ADMISSION DIAGNOSIS:  Intrauterine pregnancy at 38-4/[redacted] weeks gestation with spontaneous rupture of membranes and thick meconium stained fluid.  DISCHARGE DIAGNOSES: 1. Intrauterine pregnancy at 38-4/[redacted] weeks gestation with spontaneous rupture    of membranes and thick meconium stained fluid. 2. Fetal intolerance to labor.  PROCEDURE:  On August 31, 2001, primary low transverse cesarean section.  REASON FOR ADMISSION:  The patient is a 35 year old gravida 4, para 1, with an estimated gestational age of 38-4/7 weeks.  The patient presented complaining of uterine contractions.  While in the maternity admissions unit spontaneous rupture of membranes with thick meconium was noted.  The patient was admitted, received amnio infusion, epidural, and penicillin.  She progressed to 9 cm, and at that point repetitive deep variables around 30s to 50s were noted that did not respond to position change, oxygen boluses, or amnio infusion.  Given fetal intolerance to labor, the patient was taken emergently to the OR for a primary cesarean section.  HOSPITAL COURSE:  The patient was taken to the operating room and underwent the above named procedure without complications, was productive of a viable female infant with Apgars of 7 at one minute and 7 at five minutes, and an arterial cord pH of 7.30.  The baby was taken to the NICU in an incubator for grunting and respiratory distress.  On postoperative day #1, the patient had a good return of bowel function and good pain control.  Baby was stable in NICU. The patients hemoglobin was 9.8, hematocrit 29.3, and  white blood cell count 14.9.  On postoperative day #2, the patient remained without complaint.  She was ambulating well without difficulty, and tolerating a regular diet.  She was discharged home on postoperative day #3.  CONDITION ON DISCHARGE:  Good.  DIET:  Regular as tolerated.  ACTIVITY:  No heavy lifting, no driving, no vaginal entry.  FOLLOWUP:  She is to follow up in the office in 1 to 2 weeks for incision check, and she is to call for temperature greater than 100 degrees, persistent nausea or vomiting, heavy vaginal bleeding, and/or redness or drainage from the incision site.  DISCHARGE MEDICATIONS: 1. Prenatal vitamin one p.o. q.d. 2. Tylox as directed by M.D. Dictated by:   Danie Chandler, R.N. Attending Physician:  Marcelle Overlie DD:  09/18/01 TD:  09/18/01 Job: [id number]

## 2011-02-02 NOTE — Discharge Summary (Signed)
NAME:  Debbie Sexton, Debbie Sexton                        ACCOUNT NO.:  [id number]   MEDICAL RECORD NO.:  [id number]                   PATIENT TYPE:  INP   LOCATION:  9146                                 FACILITY:  [facility]   PHYSICIAN:  Juluis Mire, M.D.                DATE OF BIRTH:  Oct 01, 1975   DATE OF ADMISSION:  12/07/2003  DATE OF DISCHARGE:  12/11/2003                                 DISCHARGE SUMMARY   ADMITTING DIAGNOSES:  1. Intrauterine pregnancy with twin gestation at 72 and one-seventh weeks     estimated gestational age.  2. Intrauterine growth retardation on twin B.  3. Mature amniocentesis.  4. Previous cesarean section.  5. Multiparity, desires permanent sterilization.   DISCHARGE DIAGNOSES:  1. Status post low transverse cesarean section.  2. Viable female infants.  3. Permanent sterilization.   PROCEDURE:  1. Repeat low transverse cesarean section.  2. Modified Pomeroy bilateral tubal ligation.   REASON FOR ADMISSION:  Please see written H&P.   HOSPITAL COURSE:  The patient was a 35 year old gravida 5 para 2 that was  admitted to [facility] for a scheduled cesarean delivery.  The patient had been followed closely during the pregnancy with twin B noted  to be intrauterine growth retardation.  Amniocentesis had been performed  revealing a mature fetal lung maturity.  On day of admission the patient was  taken to the operating room where spinal anesthesia was administered without  difficulty.  A low transverse incision was made with the delivery of a  viable female infant, twin A, weighing 6 pounds 0.1 ounces, Apgars of 7 at one  minute and 8 at five minutes.  Twin B also a viable female infant was also  delivered weighing 3 pounds 12.5 ounces with Apgars of 8 at one minute and 9  at five minutes.  Bilateral tubal ligation was performed without difficulty.  The patient tolerated the procedure well and was taken to the recovery room  in stable condition.  On  postoperative day #1 the patient was without  complaint.  The babies were both in the NICU.  Vital signs were stable; she  was afebrile.  Abdomen was soft with good return of bowel function.  Fundus  was firm and nontender.  Abdominal dressing was noted to be clean, dry, and  intact.  The patient was ambulating well.  Labs revealed hemoglobin of 8.6;  platelet count of 232,000; wbc count of 9.6.  On postoperative day #2 the  patient was without complaint.  The babies were stable, both on room air.  Vital signs were stable; the patient was afebrile.  Abdomen was soft, fundus  was firm and nontender.  Incision was clean, dry, and intact.  The patient  had previously complained of some pain in her right thigh which was sore;  however, it was improved today.  The patient thought it might be related to  a Benadryl injection that she had been given.  On postoperative day #3 vital  signs were stable, fundus was firm and nontender.  Incision was clean, dry,  and intact.  The patient was tolerating a regular diet without complaints of  nausea and vomiting.  Pain was resolved in right upper thigh.  On  postoperative day #4 the patient was without complaint.  Vital signs were  stable.  Abdomen was soft, fundus was firm and nontender.  Incision was  clean, dry, and intact.  Staples were removed and the patient was discharged  home.   CONDITION ON DISCHARGE:  Good.   DIET:  Regular as tolerated..   ACTIVITY:  No heavy lifting, no driving x2 weeks, no vaginal entry.   FOLLOW-UP:  The patient is to follow up in the office in 1 week for an  incision check.  She is to call for temperature greater than 100 degrees,  persistent nausea and vomiting, heavy vaginal bleeding, and/or redness or  drainage from the incisional site.   DISCHARGE MEDICATIONS:  1. Tylox #30 one p.o. q.4-6h. p.r.n. pain.  2. Prenatal vitamins one p.o. daily.  3. Motrin 600 mg q.6h. p.r.n.  4. Colace one p.o. daily p.r.n.      Julio Sicks, N.P.                        Juluis Mire, M.D.    CC/MEDQ  D:  02/08/2004  T:  02/09/2004  Job:  [id number]

## 2011-02-02 NOTE — Op Note (Signed)
[facility]  Patient:    Debbie Sexton, Debbie Sexton                       MRN: [id number] Proc. Date: 03/07/00 Adm. Date:  [id number] Disc. Date: [id number] Attending:  Mickle Mallory                           Operative Report  PREOPERATIVE DIAGNOSIS:       Seven-week gestational size intrauterine                               pregnancy - inevitable miscarriage. Blood type                               AB+.  POSTOPERATIVE DIAGNOSIS:      Seven-week gestational size intrauterine                               pregnancy - inevitable miscarriage. Blood type                               AB+. Pathology pending.  OPERATION/PROCEDURE:          Suction dilatation and curettage for evacuation                               of an inevitable miscarriage.  ANESTHESIA:                   IV conscious sedation.                               1% Xylocaine without epinephrine, paracervical                               block (12 cc).  SURGEON:                      Gerrit Friends. Aldona Bar, M.D.  ASSISTANT:  HISTORY OF PRESENT ILLNESS:  This 35 year old gravida 3, para 1, was seen by me in the emergency room on the morning of March 07, 2000. She presented earlier to the [facility] emergency room for evaluation of vaginal bleeding. Apparently, she had been seen in early June in the emergency room at [facility] and was found to have a six-week intrauterine pregnancy. Her evaluation prior to my being called today, revealed on follow-up ultrasound, that the estimated gestational age was approximately 7 weeks and no fetal heart was noted and the diagnosis of missed AB was made.  I was called by Dr. Adriana Simas to come see the patient and upon evaluation in the emergency room, ascertained that the patient would likely need a dilatation and curettage ultimately and after discussion with the patient and an informed consent from the patient, she was transferred to the outpatient area,  awaiting suction dilatation and curettage for evacuation of an inevitable AB.  DESCRIPTION OF PROCEDURE:  The patient was taken to the operating room and after satisfactory induction of intravenous conscious sedation, she was prepped and draped having been  placed in the lithotomy position in the shepherds stirrups. The bladder was drained of clear urine. At this time, a speculum was placed in the vagina. A single-tooth tenaculum was placed  on the anterior lip of the cervix, and a paracervical block carried out of approximately 12 cc of 1% Xylocaine with epinephrine. Therefore, with minimal difficulty, the internal os was dilated with a #25 Pratt dilator and thereafter using the #8 suction curette, the cavity was thoroughly, gently, and systematically evacuated of all products of conception. This was confirmed by sharp curettage with the median standard curette and thereafter resuctioning produced no additional products of conception. Examination at this time found the uterus to be normal size, mobile. Bleeding was minimal. At this time, the procedure was felt to be complete and was terminated. All instruments were removed. The patient was awakened and transported to the recovery room from which she will be discharged to home.  DISCHARGE MEDICATIONS:  Keflex 500 mg twice daily for a total of three days to avoid any infection. She will use Advil or Motrin as needed for cramping. She will be asked to return to my office for followup in my office in approximately two weeks time, or as needed.  CONDITION ON ARRIVAL IN RECOVERY AREA:  Satisfactory. DD:  03/07/00 TD:  03/11/00 Job: [id number]

## 2011-02-02 NOTE — Op Note (Signed)
NAME:  Debbie Sexton, Debbie Sexton                        ACCOUNT NO.:  [id number]   MEDICAL RECORD NO.:  [id number]                   PATIENT TYPE:  INP   LOCATION:  9146                                 FACILITY:  [facility]   PHYSICIAN:  Michelle L. Vincente Poli, M.D.            DATE OF BIRTH:  1975-12-09   DATE OF PROCEDURE:  DATE OF DISCHARGE:                                 OPERATIVE REPORT   PREOPERATIVE DIAGNOSES:  1. Twin intrauterine pregnancy at 35-1/y weeks.  2. Intrauterine growth retardation twin B.  3. Mature amniocentesis.  4. Previous cesarean section.  5. Wants permanent sterilization.   POSTOPERATIVE DIAGNOSES:  1. Twin intrauterine pregnancy at 35-1/y weeks.  2. Intrauterine growth retardation twin B.  3. Mature amniocentesis.  4. Previous cesarean section.  5. Wants permanent sterilization.   OPERATION/PROCEDURE:  1. Repeat low transverse cesarean section.  2. Modified Pomeroy bilateral tubal ligation.   SURGEON:  Michelle L. Vincente Poli, M.D.   ANESTHESIA:  Spinal.   ESTIMATED BLOOD LOSS:  500 mL.   DESCRIPTION OF PROCEDURE:  The patient was taken to the operating room.  Her  spinal was placed and she was placed in the dorsal supine position with a  leftward tilt.  She was prepped and draped in the usual sterile fashion.  A  Foley catheter was inserted into the bladder.   A low transverse incision was made in the area of the previous cesarean  section and carried down to the fascia.  The fascia was scored in the  midline and extended laterally.  Pfannenstiel incision was developed.  The  rectus muscles were separated in the midline and the peritoneum was entered  bluntly.  The peritoneum was entered through the incision and __________.  The bladder blade was inserted and the lower uterine segment was identified  and the bladder flap was then created sharply and then digitally.  The  bladder blade was readjusted.  A low transverse incision was made in the  uterus.  The sac of  twin A was ruptured and noted to be clear fluid.  The  baby was in vertex position, delivered quite easily.  It was a female infant.  Twin B also came down easily, vertex.  The sac of twin B was ruptured and  noted to have clear fluid and was delivered in cephalic presentation without  difficulty.  Both babies were immediately evaluated by neonatologist and  taken to the neonatal ICU.  It was obvious that twin B was much smaller and  appeared consistent with IUGR.  We then obtained cord blood from both cords.  The placentas were removed and sent to pathology for analysis.  The uterus  was exteriorized and cleared of all clots and debris.  Uterine incision was  closed in a single layer using a continuous running locked stitch.   The tubes were then inspected. Fimbriated ends were seen on both sides.  The  mid portion  was grasped with a Babcock clamp.  A 2 cm knuckle was tied off  on each side and it was tied off using a plain gut suture x2.  That knuckle  was excised on both sides.  After the modified Pomeroy bilateral tubal  ligation was performed, uterus was returned to the abdomen.  Irrigation was  performed and incision was reinspected and noted to be hemostatic.   The peritoneum was closed using 0 Vicryl continuous running stitch and the  rectus muscles were reapproximated using the same 0 Vicryl.  The fascia was  closed using 0 Vicryl continuous running stitch with each corner meeting in  the midline.  After irrigation of the subcutaneous layer and noting no  bleeders, the skin was closed with staples.  All sponge, lap and instrument  counts were correct x2.  The patient went to the recovery room in stable  condition.                                               Michelle L. Vincente Poli, M.D.    Florestine Avers  D:  12/07/2003  T:  12/08/2003  Job:  [id number]

## 2011-02-02 NOTE — Op Note (Signed)
[facility]  Patient:    Debbie Sexton, NIZIOLEK Visit Number: [id number] MRN: [id number]          Service Type: Attending:  Marcelle Overlie, M.D. Dictated by:   Marcelle Overlie, M.D. Proc. Date: 08/31/01                             Operative Report  PREOPERATIVE DIAGNOSES:       1. Intrauterine pregnancy at 38-4/7 weeks.                               2. Spontaneous rupture of membranes.                               3. Thick meconium-stained fluid.                               4. Fetal intolerance to labor.  POSTOPERATIVE DIAGNOSES:      1. Intrauterine pregnancy at 38-4/7 weeks.                               2. Spontaneous rupture of membranes.                               3. Thick meconium-stained fluid.                               4. Fetal intolerance to labor.  PROCEDURE:                    Primary low transverse cesarean section.  SURGEON:                      Marcelle Overlie, M.D.  ANESTHESIA:                   Epidural.  ESTIMATED BLOOD LOSS:         500 cc.  FINDINGS:                     Female infant with Apgars 7 at one minute and 7 at five minutes with a cord pH of 7.30.  DESCRIPTION OF PROCEDURE:     The patient was taken to the operating room where her epidural was dosed and found to be adequate. She was then prepped quickly with a Betadine splash and a sterile drape was applied. A Foley catheter was already in the bladder. Using a scalpel, a low transverse incision was made and carried down to the fascia. The fascia was scored in the midline and extended laterally using Mayo scissors. A Pfannenstiel incision was then created. The rectus muscles were separated bluntly and the peritoneum was entered bluntly and the incision was then stretched. The bladder blade was inserted, the lower uterine segment was identified, and the bladder flap was created sharply and then digitally. A low transverse incision was made in the uterus and the  baby was noted to be in OP position with a tight nuchal cord noted. The baby was delivered easily, was a female infant with Apgars of 7 at one  minute and 7 at five minutes and a cord pH of 7.30. The cord pH was obtained, cord blood was obtained, and the placenta was manually removed and noted to be intact and was sent to pathology. It was noted to be meconium stained. The uterus was cleared of all clots and debris. The uterine incision was closed in a single layer using 0 chromic in continuous running lock stitch. The peritoneum was closed using 0 Vicryl continuous running stitch and the rectus muscles were reapproximated using the same 0 Vicryl. The fascia was closed using 0 Vicryl in continuous running stitch starting at each corner and meeting in the midline. After inspection of the subcutaneous layers, the skin was closed with staples. All sponge lap, and instrument counts were correct x 2. The patient tolerated the procedure well and went to recovery room in stable condition. Of note, the baby was taken to the NICU by the neonatologist in the incubator for grunting and respiratory distress. Dictated by:   Marcelle Overlie, M.D. Attending:  Marcelle Overlie, M.D. DD:  08/31/01 TD:  08/31/01 Job: [id number]

## 2011-02-07 ENCOUNTER — Emergency Department (HOSPITAL_COMMUNITY)
Admission: EM | Admit: 2011-02-07 | Discharge: 2011-02-08 | Disposition: A | Discharge status: Home / Self Care | Payer: Managed Care, Other (non HMO) | Attending: Emergency Medicine | Admitting: Emergency Medicine

## 2011-02-07 DIAGNOSIS — J45909 Unspecified asthma, uncomplicated: Secondary | ICD-10-CM | POA: Insufficient documentation

## 2011-02-07 DIAGNOSIS — R51 Headache: Secondary | ICD-10-CM | POA: Insufficient documentation

## 2011-02-07 DIAGNOSIS — R209 Unspecified disturbances of skin sensation: Secondary | ICD-10-CM | POA: Insufficient documentation

## 2011-02-07 DIAGNOSIS — J069 Acute upper respiratory infection, unspecified: Secondary | ICD-10-CM | POA: Insufficient documentation

## 2011-02-07 DIAGNOSIS — Z8673 Personal history of transient ischemic attack (TIA), and cerebral infarction without residual deficits: Secondary | ICD-10-CM | POA: Insufficient documentation

## 2011-02-08 ENCOUNTER — Emergency Department (HOSPITAL_COMMUNITY): Payer: Managed Care, Other (non HMO)

## 2011-02-08 LAB — POCT I-STAT, CHEM 8
Calcium, Ion: 1.19 mmol/L (ref 1.12–1.32)
Creatinine, Ser: 0.9 mg/dL (ref 0.4–1.2)
Glucose, Bld: 93 mg/dL (ref 70–99)
Hemoglobin: 12.6 g/dL (ref 12.0–15.0)
TCO2: 25 mmol/L (ref 0–100)

## 2011-04-06 ENCOUNTER — Ambulatory Visit (INDEPENDENT_AMBULATORY_CARE_PROVIDER_SITE_OTHER): Payer: Managed Care, Other (non HMO) | Admitting: Internal Medicine

## 2011-04-06 ENCOUNTER — Encounter: Payer: Self-pay | Admitting: Internal Medicine

## 2011-04-06 VITALS — BP 92/72 | HR 65 | Temp 97.8°F | Ht 60.0 in | Wt 167.0 lb

## 2011-04-06 DIAGNOSIS — K219 Gastro-esophageal reflux disease without esophagitis: Secondary | ICD-10-CM

## 2011-04-06 DIAGNOSIS — E785 Hyperlipidemia, unspecified: Secondary | ICD-10-CM

## 2011-04-06 DIAGNOSIS — F411 Generalized anxiety disorder: Secondary | ICD-10-CM

## 2011-04-06 DIAGNOSIS — F419 Anxiety disorder, unspecified: Secondary | ICD-10-CM | POA: Insufficient documentation

## 2011-04-06 MED ORDER — OMEPRAZOLE 20 MG PO CPDR: 20.0000 mg | DELAYED_RELEASE_CAPSULE | Freq: Every day | ORAL | Status: DC

## 2011-04-06 MED ORDER — SERTRALINE HCL 25 MG PO TABS: 25.0000 mg | ORAL_TABLET | Freq: Every day | ORAL | Status: DC

## 2011-04-06 NOTE — Assessment & Plan Note (Signed)
Increase symptoms with daily ASA - resume PPI - erx done may help esoph "choking" sensation with meals (no regurgitation or vomiting)

## 2011-04-06 NOTE — Progress Notes (Signed)
  Subjective:    Patient ID: Debbie Sexton, female    DOB: July 08, 1976, 35 y.o.   MRN: [id number]  HPI  complains of increase anxiety symptoms - Feels irritable, tearful, unable to sleep due to early /mid sleep awakenings No panic attacks - no chest pain or shortness of breath but "nervous" +overlap depression symptoms since TIA 11/2010 (dx/tx ongoing at [facility] to date)  Past Medical History  Diagnosis Date  . Asthma   . Anemia   . GERD (gastroesophageal reflux disease)   . COPD (chronic obstructive pulmonary disease)   . Dyslipidemia   . Goiter   . Chronic rhinitis   . Eczema   . TIA (transient ischemic attack) 11/2010    dx at [facility]  . Hyperlipidemia      Review of Systems  Respiratory: Positive for choking.   Cardiovascular: Negative for chest pain.  Neurological: Negative for headaches.  Psychiatric/Behavioral: Positive for decreased concentration. Negative for suicidal ideas and self-injury.       Objective:   Physical Exam BP 92/72  Pulse 65  Temp(Src) 97.8 F (36.6 C) (Oral)  Ht 5' (1.524 m)  Wt 167 lb (75.751 kg)  BMI 32.62 kg/m2  SpO2 99%  Constitutional: She is oriented to person, place, and time. She appears well-developed and well-nourished. No distress.  Neck: Normal range of motion. Neck supple. No JVD present. No thyromegaly present.  Cardiovascular: Normal rate, regular rhythm and normal heart sounds.  No murmur heard. No BLE edema. Pulmonary/Chest: Effort normal and breath sounds normal. No respiratory distress. She has no wheezes.  Neurological: She is alert and oriented to person, place, and time. No cranial nerve deficit. Coordination normal. Psychiatric: She has a depressed mood and affect. Tearful but laughing. Judgment and thought content normal.   Lab Results  Component Value Date   WBC 11.4* 01/12/2011   HGB 12.6 02/08/2011   HCT 37.0 02/08/2011   PLT 404* 01/12/2011   CHOL 200 05/02/2010   TRIG 109.0 05/02/2010   HDL 58.50 05/02/2010   ALT  19 05/02/2010   AST 18 05/02/2010   NA 138 02/08/2011   K 3.6 02/08/2011   CL 103 02/08/2011   CREATININE 0.90 02/08/2011   BUN 13 02/08/2011   CO2 25 01/12/2011   TSH 0.67 08/17/2010        Assessment & Plan:  See problem list. Medications and labs reviewed today. Send for records from [place] providers as pt will now follow here

## 2011-04-06 NOTE — Patient Instructions (Signed)
It was good to see you today. Will send for records from Dr. Julie [place] and Dorena Cookey as discussed Start sertraline and omeprazole for anxiety and throat - Your prescription(s) have been submitted to your pharmacy. Please take as directed and contact our office if you believe you are having problem(s) with the medication(s). Please schedule followup in 4-6 weeks to review records and medications and symptoms; call sooner if problems.

## 2011-04-06 NOTE — Assessment & Plan Note (Signed)
Started on statin after 11/2010 TIA event at [facility] for and review records/labs

## 2011-04-06 NOTE — Assessment & Plan Note (Signed)
Overlap with depression since TIA events 11/2010 -  Will start low dose daily sertraline - verified no si/hi potential risk/benefit reviewed; pt understands and agrees to same  - erx done follow up 4-6 weeks, sooner if problems

## 2011-06-20 LAB — CBC
MCHC: 32.6
Platelets: 360
RDW: 15.2

## 2011-06-20 LAB — DIFFERENTIAL
Eosinophils Absolute: 0.2
Lymphs Abs: 0.7
Monocytes Relative: 3
Neutrophils Relative %: 90 — ABNORMAL HIGH

## 2011-06-20 LAB — COMPREHENSIVE METABOLIC PANEL
ALT: 17
Albumin: 3.6
Calcium: 9.3
GFR calc Af Amer: >60
Glucose, Bld: 97
Sodium: 134 — ABNORMAL LOW
Total Protein: 7.2

## 2011-06-20 LAB — URINALYSIS, ROUTINE W REFLEX MICROSCOPIC
Glucose, UA: NEGATIVE
Ketones, ur: 15 — AB
pH: 6

## 2011-09-11 IMAGING — CT CT HEAD W/O CM
1 of 2 series · 13 of 30 positions shown, 17 images · non-contrast
Comparison: CT of the head performed 01/12/2011

CLINICAL DATA: Numbness and tingling in the right hand; headache.

CT HEAD WITHOUT CONTRAST
TECHNIQUE: Contiguous axial images were obtained from the base of
the skull through the vertex without contrast.

[Series 2: brain · axial · 0.47mm/px · z∈[+136,+256]mm · 13 of 28 slices shown, 17 images]
[im 2/28  brain]
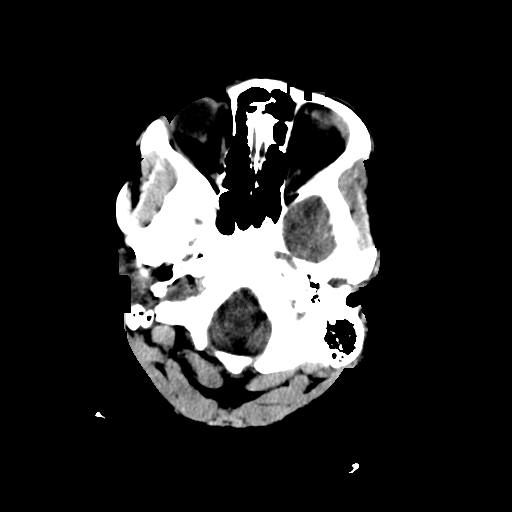
[im 2/28  bone]
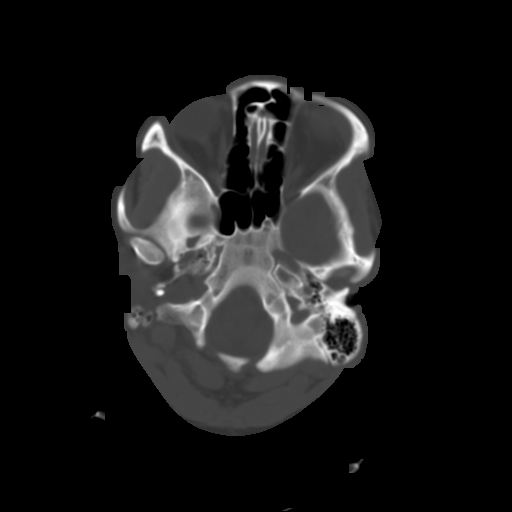
[im 4/28  brain]
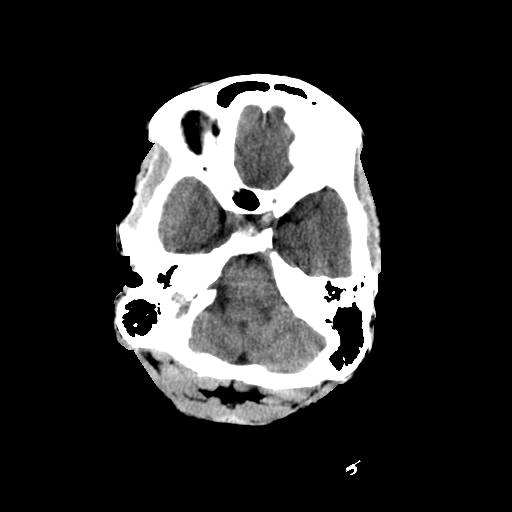
[im 6/28  brain]
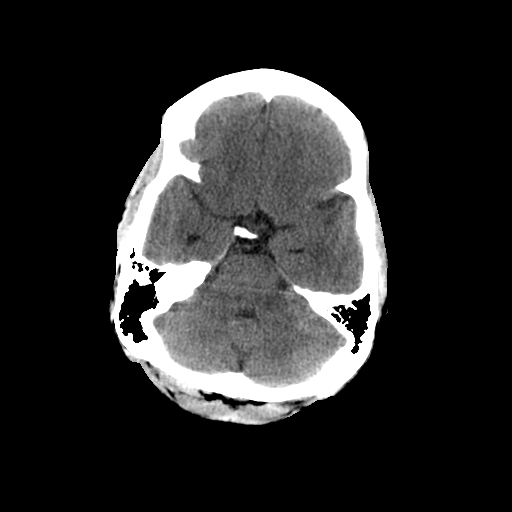
[im 8/28  brain]
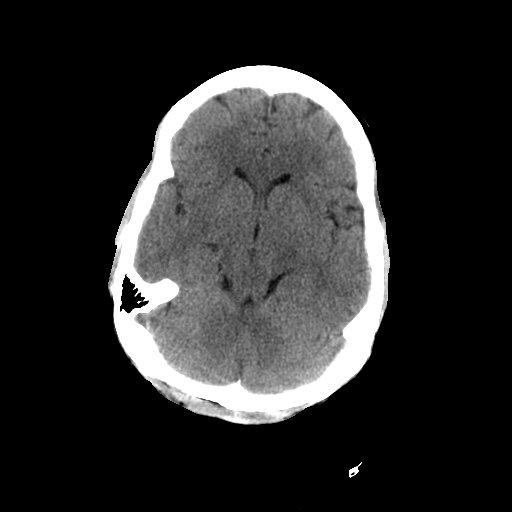
[im 10/28  brain]
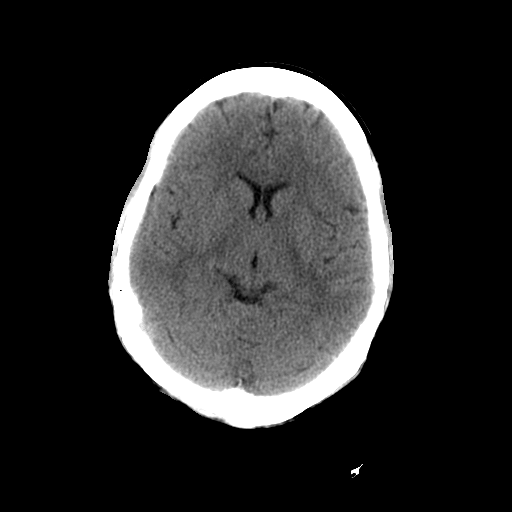
[im 10/28  bone]
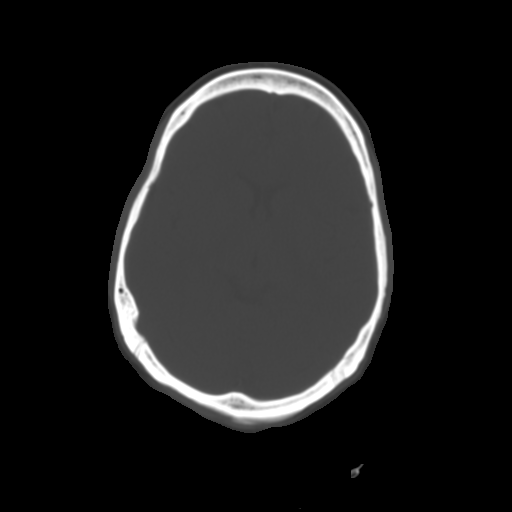
[im 12/28  brain]
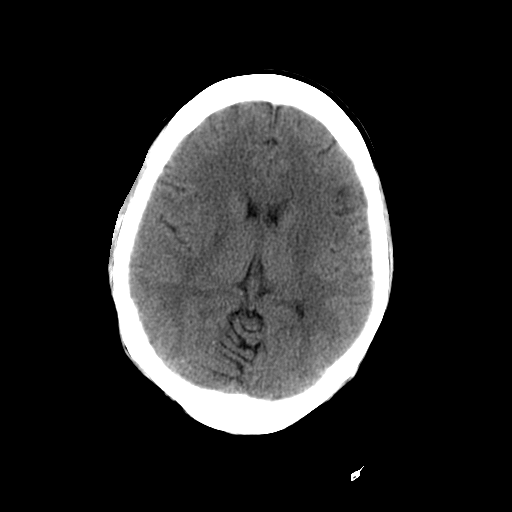
[im 14/28  brain]
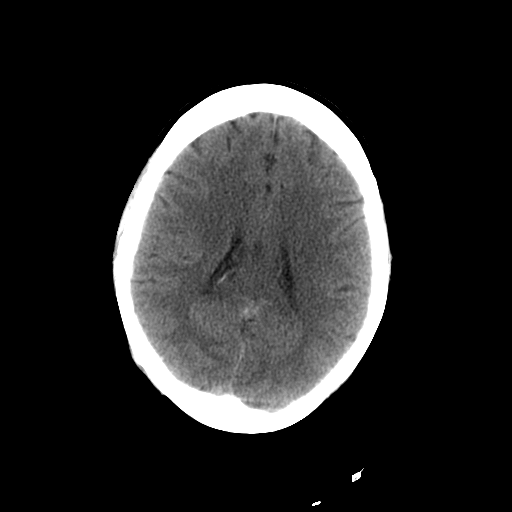
[im 16/28  brain]
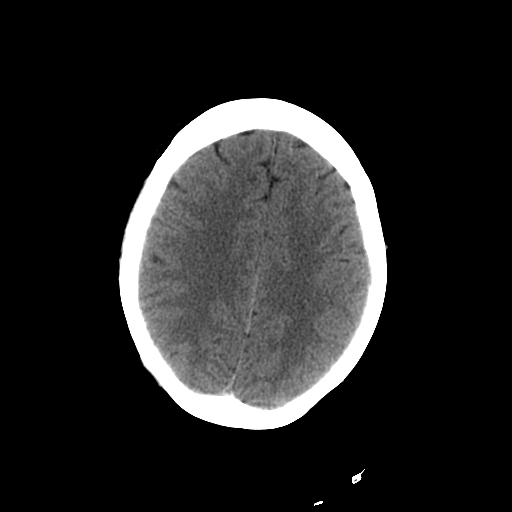
[im 18/28  brain]
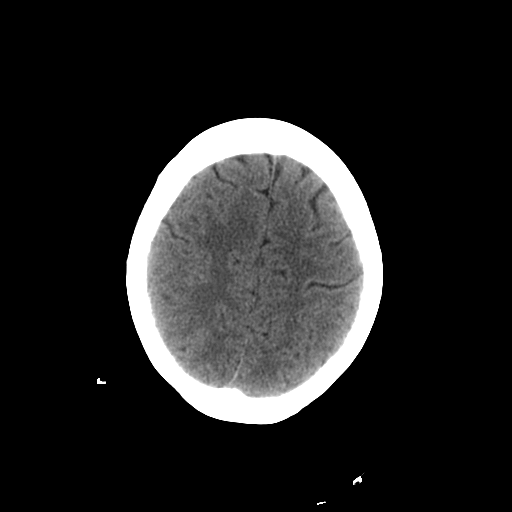
[im 18/28  bone]
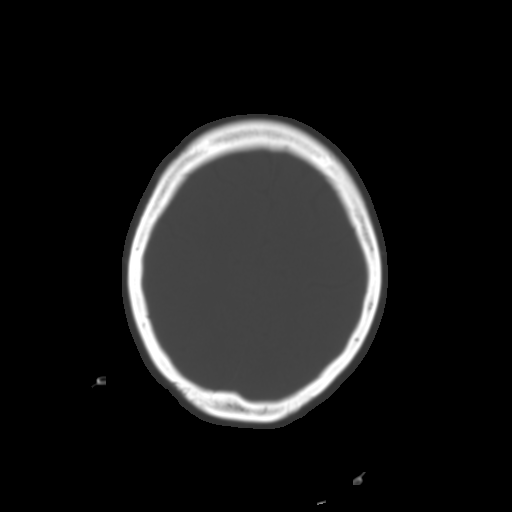
[im 20/28  brain]
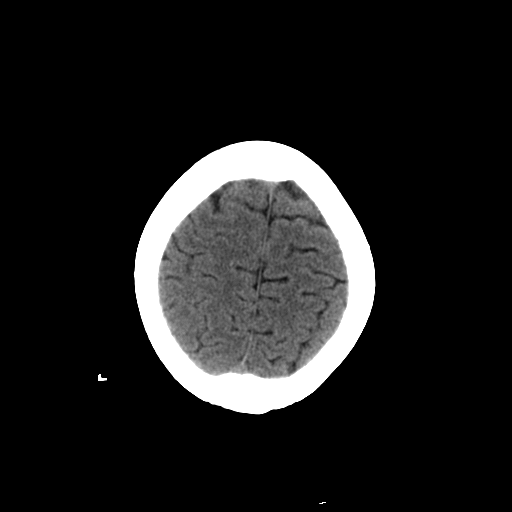
[im 22/28  brain]
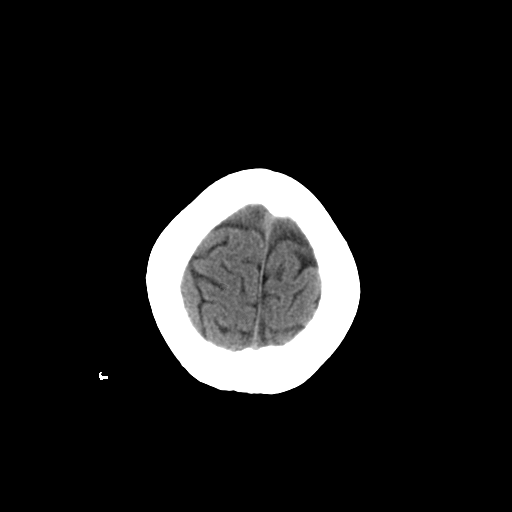
[im 24/28  brain]
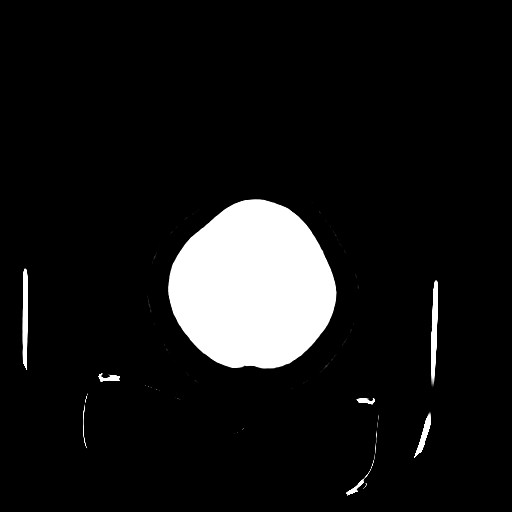
[im 26/28  brain]
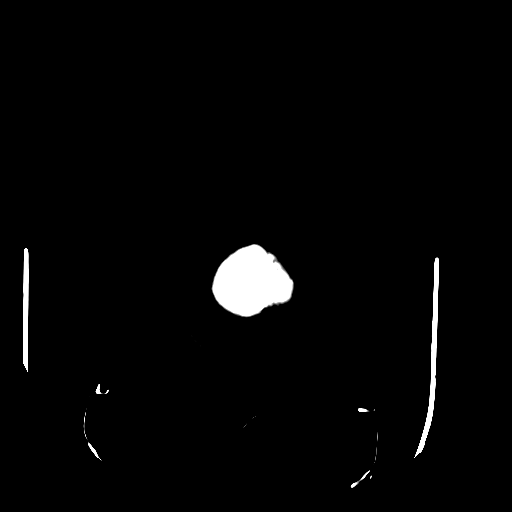
[im 26/28  bone]
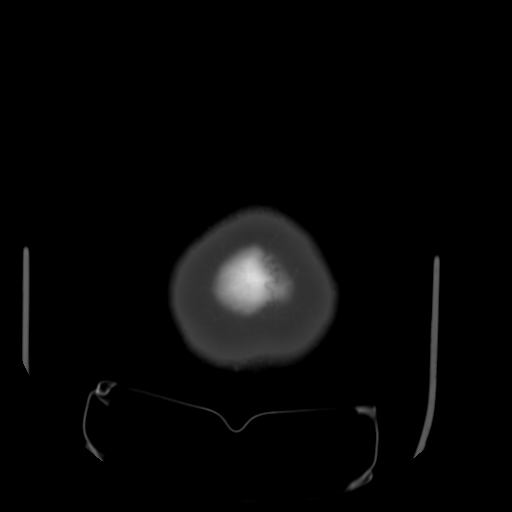

[13 of 30 positions shown; findings below may reference images not displayed]

FINDINGS: There is no evidence of acute infarction, mass lesion, or
intra- or extra-axial hemorrhage on CT.

The posterior fossa, including the cerebellum, brainstem and fourth
ventricle, is within normal limits.  The third and lateral
ventricles, and basal ganglia are unremarkable in appearance.  The
cerebral hemispheres are symmetric in appearance, with normal gray-
white differentiation.  No mass effect or midline shift is seen.

There is no evidence of fracture; visualized osseous structures are
unremarkable in appearance.  The visualized portions of the orbits
are within normal limits.  The paranasal sinuses and mastoid air
cells are well-aerated.  No significant soft tissue abnormalities
are seen.
IMPRESSION: Unremarkable noncontrast CT of the head.

## 2011-10-13 ENCOUNTER — Encounter (HOSPITAL_COMMUNITY): Payer: Self-pay | Admitting: Emergency Medicine

## 2011-10-13 ENCOUNTER — Emergency Department (HOSPITAL_COMMUNITY)
Admission: EM | Admit: 2011-10-13 | Discharge: 2011-10-13 | Disposition: A | Discharge status: Home / Self Care | Payer: Managed Care, Other (non HMO) | Attending: Emergency Medicine | Admitting: Emergency Medicine

## 2011-10-13 DIAGNOSIS — J4489 Other specified chronic obstructive pulmonary disease: Secondary | ICD-10-CM | POA: Insufficient documentation

## 2011-10-13 DIAGNOSIS — E785 Hyperlipidemia, unspecified: Secondary | ICD-10-CM | POA: Insufficient documentation

## 2011-10-13 DIAGNOSIS — K219 Gastro-esophageal reflux disease without esophagitis: Secondary | ICD-10-CM | POA: Insufficient documentation

## 2011-10-13 DIAGNOSIS — Z79899 Other long term (current) drug therapy: Secondary | ICD-10-CM | POA: Insufficient documentation

## 2011-10-13 DIAGNOSIS — G514 Facial myokymia: Secondary | ICD-10-CM

## 2011-10-13 DIAGNOSIS — Z8673 Personal history of transient ischemic attack (TIA), and cerebral infarction without residual deficits: Secondary | ICD-10-CM | POA: Insufficient documentation

## 2011-10-13 DIAGNOSIS — G518 Other disorders of facial nerve: Secondary | ICD-10-CM | POA: Insufficient documentation

## 2011-10-13 DIAGNOSIS — R350 Frequency of micturition: Secondary | ICD-10-CM | POA: Insufficient documentation

## 2011-10-13 DIAGNOSIS — J449 Chronic obstructive pulmonary disease, unspecified: Secondary | ICD-10-CM | POA: Insufficient documentation

## 2011-10-13 DIAGNOSIS — R3915 Urgency of urination: Secondary | ICD-10-CM | POA: Insufficient documentation

## 2011-10-13 LAB — URINALYSIS, ROUTINE W REFLEX MICROSCOPIC
Leukocytes, UA: NEGATIVE
Nitrite: NEGATIVE
Specific Gravity, Urine: 1.021 (ref 1.005–1.030)
pH: 7.5 (ref 5.0–8.0)

## 2011-10-13 NOTE — ED Notes (Signed)
Pt. Stated, yesterday I started having some twitching underneath rt eye continuously is twitching. I feel like my face is swollen also.

## 2011-10-13 NOTE — ED Provider Notes (Signed)
History     CSN: [id number]  Arrival date & time 10/13/11  1432   First MD Initiated Contact with Patient 10/13/11 1610      Chief Complaint  Patient presents with  . Eye Problem    (Consider location/radiation/quality/duration/timing/severity/associated sxs/prior treatment) Patient is a 36 y.o. female presenting with eye problem. The history is provided by the patient.  Eye Problem  This is a new problem. The current episode started yesterday. The problem occurs constantly. Progression since onset: resolved momentarily then returned. There is pain in the right eye. There was no injury mechanism. The patient is experiencing no pain. There is no history of trauma to the eye. She does not wear contacts. Pertinent negatives include no numbness, no blurred vision, no decreased vision, no discharge, no double vision, no foreign body sensation, no photophobia, no eye redness, no nausea, no vomiting and no weakness. She has tried nothing for the symptoms.    Past Medical History  Diagnosis Date  . Asthma   . Anemia   . GERD (gastroesophageal reflux disease)   . COPD (chronic obstructive pulmonary disease)   . Dyslipidemia   . Goiter   . Chronic rhinitis   . Eczema   . TIA (transient ischemic attack) 11/2010    dx at [facility]  . Hyperlipidemia     Past Surgical History  Procedure Date  . Cesarean section     x2  . Tubal ligation     Family History  Problem Relation Age of Onset  . Asthma Mother   . Allergies Mother   . Lung cancer Maternal Grandmother     History  Substance Use Topics  . Smoking status: Former Smoker    Types: Cigarettes    Quit date: 09/17/2006  . 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 Alcohol Use: Not on file    OB History    Grav Para Term Preterm Abortions TAB SAB Ect Mult Living                  Review of Systems  Constitutional: Negative for fever.  HENT: Negative for  congestion.   Eyes: Negative for blurred vision, double vision, photophobia, discharge and redness.  Respiratory: Negative for cough and shortness of breath.   Cardiovascular: Negative for chest pain.  Gastrointestinal: Negative for nausea, vomiting, abdominal pain and diarrhea.  Genitourinary: Positive for urgency and frequency. Negative for difficulty urinating.  Musculoskeletal: Negative for back pain.  Neurological: Negative for weakness and numbness.  All other systems reviewed and are negative.    Allergies  Iodine and Iohexol  Home Medications   Current Outpatient Rx  Name Route Sig Dispense Refill  . ALBUTEROL SULFATE HFA 108 (90 BASE) MCG/ACT IN AERS Inhalation Inhale 2 puffs into the lungs every 4 (four) hours as needed. For wheezing    . ASPIRIN EC 81 MG PO TBEC Oral Take 81 mg by mouth daily.    . BECLOMETHASONE DIPROPIONATE 80 MCG/ACT IN AERS Inhalation Inhale 1 puff into the lungs daily as needed. For wheezing      BP 100/71  Pulse 82  Temp(Src) 97.8 F (36.6 C) (Oral)  Resp 16  SpO2 99%  Physical Exam  Nursing note and vitals reviewed. Constitutional: She is oriented to person, place, and time. She appears well-developed and well-nourished. No distress.  HENT:  Head: Normocephalic and atraumatic.  Mouth/Throat: Oropharynx is clear and moist.  No facial tenderness or swelling.    Eyes: Conjunctivae and EOM are normal. Pupils are equal, round, and reactive to light. Right eye exhibits no discharge and no exudate. No foreign body present in the right eye. Left eye exhibits no discharge and no exudate. No foreign body present in the left eye. Right conjunctiva is not injected. Right conjunctiva has no hemorrhage. Left conjunctiva is not injected. Left conjunctiva has no hemorrhage. No scleral icterus.       Right lower eyelid with intermittent twitching.    Neck: Neck supple.  Cardiovascular: Normal rate, regular rhythm, normal heart sounds and intact distal  pulses.   No murmur heard. Pulmonary/Chest: Effort normal and breath sounds normal. No stridor. No respiratory distress. She has no rales.  Abdominal: Soft. Bowel sounds are normal. She exhibits no distension. There is no tenderness.  Musculoskeletal: Normal range of motion.  Neurological: She is alert and oriented to person, place, and time. She has normal strength. No cranial nerve deficit or sensory deficit. Coordination normal.  Skin: Skin is warm and dry. No rash noted.  Psychiatric: She has a normal mood and affect. Her behavior is normal.    ED Course  Procedures (including critical care time)  Labs Reviewed  URINALYSIS, ROUTINE W REFLEX MICROSCOPIC - Abnormal; Notable for the following:    APPearance HAZY (*)    All other components within normal limits  GLUCOSE, CAPILLARY  POCT PREGNANCY, URINE  POCT CBG MONITORING  POCT PREGNANCY, URINE   No results found.   1. Facial twitching       MDM  36 yo female with onset of right lower eyelid twitching yesterday.  Well appearing, VSS.  No changes to vision.  VA 20/25 in both eyes.  PERRL, EOMI.  No CN deficit.  Neuro exam normal.  Pt has recently discontinued caffeine (she endorses being a heavy caffeine drinker until this week).  This is possibly the culprit.  Also complains secondarily of urinary frequency and urgency.  Will check UA and FSBS.    UA and fingerstick negative.  Twitching resolved by time of DC home.         Warnell Forester, MD 10/14/11 [phone]

## 2011-10-13 NOTE — ED Notes (Signed)
Pt denies pain/discomfort at this time.

## 2011-10-15 NOTE — ED Provider Notes (Signed)
I saw and evaluated the patient, reviewed the resident's note and I agree with the findings and plan.   .Face to face Exam:  General:  Awake HEENT:  Atraumatic Resp:  Normal effort Abd:  Nondistended Neuro:No focal weakness Lymph: No adenopathy   Debbie Roma L Tarrance Januszewski, MD 10/15/11 2253 

## 2011-12-20 ENCOUNTER — Encounter (HOSPITAL_BASED_OUTPATIENT_CLINIC_OR_DEPARTMENT_OTHER): Payer: Self-pay | Admitting: *Deleted

## 2011-12-20 ENCOUNTER — Emergency Department (HOSPITAL_BASED_OUTPATIENT_CLINIC_OR_DEPARTMENT_OTHER)
Admission: EM | Admit: 2011-12-20 | Discharge: 2011-12-20 | Disposition: A | Discharge status: Home / Self Care | Payer: Self-pay | Attending: Emergency Medicine | Admitting: Emergency Medicine

## 2011-12-20 ENCOUNTER — Emergency Department (INDEPENDENT_AMBULATORY_CARE_PROVIDER_SITE_OTHER): Payer: Self-pay

## 2011-12-20 ENCOUNTER — Other Ambulatory Visit: Payer: Self-pay

## 2011-12-20 ENCOUNTER — Encounter (HOSPITAL_BASED_OUTPATIENT_CLINIC_OR_DEPARTMENT_OTHER): Payer: Self-pay

## 2011-12-20 DIAGNOSIS — Z8673 Personal history of transient ischemic attack (TIA), and cerebral infarction without residual deficits: Secondary | ICD-10-CM | POA: Insufficient documentation

## 2011-12-20 DIAGNOSIS — H538 Other visual disturbances: Secondary | ICD-10-CM | POA: Insufficient documentation

## 2011-12-20 DIAGNOSIS — R110 Nausea: Secondary | ICD-10-CM | POA: Insufficient documentation

## 2011-12-20 DIAGNOSIS — R51 Headache: Secondary | ICD-10-CM

## 2011-12-20 DIAGNOSIS — Z79899 Other long term (current) drug therapy: Secondary | ICD-10-CM | POA: Insufficient documentation

## 2011-12-20 DIAGNOSIS — G43909 Migraine, unspecified, not intractable, without status migrainosus: Secondary | ICD-10-CM | POA: Insufficient documentation

## 2011-12-20 DIAGNOSIS — J449 Chronic obstructive pulmonary disease, unspecified: Secondary | ICD-10-CM | POA: Insufficient documentation

## 2011-12-20 DIAGNOSIS — R209 Unspecified disturbances of skin sensation: Secondary | ICD-10-CM | POA: Insufficient documentation

## 2011-12-20 DIAGNOSIS — J4489 Other specified chronic obstructive pulmonary disease: Secondary | ICD-10-CM | POA: Insufficient documentation

## 2011-12-20 DIAGNOSIS — R4182 Altered mental status, unspecified: Secondary | ICD-10-CM | POA: Insufficient documentation

## 2011-12-20 LAB — CBC
Hemoglobin: 11.6 g/dL — ABNORMAL LOW (ref 12.0–15.0)
MCH: 24.1 pg — ABNORMAL LOW (ref 26.0–34.0)
MCHC: 32.9 g/dL (ref 30.0–36.0)
RDW: 15.7 % — ABNORMAL HIGH (ref 11.5–15.5)

## 2011-12-20 LAB — BASIC METABOLIC PANEL
BUN: 12 mg/dL (ref 6–23)
CO2: 25 mEq/L (ref 19–32)
Calcium: 9.4 mg/dL (ref 8.4–10.5)
Chloride: 101 mEq/L (ref 96–112)
Creatinine, Ser: 0.7 mg/dL (ref 0.50–1.10)
GFR calc Af Amer: >90 mL/min (ref >90)
GFR calc non Af Amer: >90 mL/min (ref >90)
Glucose, Bld: 62 mg/dL — ABNORMAL LOW (ref 70–99)
Potassium: 3.5 mEq/L (ref 3.5–5.1)
Sodium: 135 mEq/L (ref 135–145)

## 2011-12-20 LAB — DIFFERENTIAL
Basophils Absolute: 0 10*3/uL (ref 0.0–0.1)
Basophils Relative: 0 % (ref 0–1)
Eosinophils Absolute: 0.5 10*3/uL (ref 0.0–0.7)
Monocytes Absolute: 0.9 10*3/uL (ref 0.1–1.0)
Monocytes Relative: 8 % (ref 3–12)
Neutrophils Relative %: 65 % (ref 43–77)

## 2011-12-20 MED ORDER — METOCLOPRAMIDE HCL 5 MG/ML IJ SOLN
10.0000 mg | Freq: Once | INTRAMUSCULAR | Status: AC
  Administered 2011-12-20: 10 mg via INTRAVENOUS
  Filled 2011-12-20: qty 2

## 2011-12-20 MED ORDER — METOCLOPRAMIDE HCL 10 MG PO TABS: 10.0000 mg | ORAL_TABLET | Freq: Four times a day (QID) | ORAL | Status: DC | PRN

## 2011-12-20 MED ORDER — DIPHENHYDRAMINE HCL 50 MG/ML IJ SOLN
25.0000 mg | Freq: Once | INTRAMUSCULAR | Status: AC
  Administered 2011-12-20: 25 mg via INTRAVENOUS
  Filled 2011-12-20: qty 1

## 2011-12-20 MED ORDER — DEXTROSE 50 % IV SOLN
25.0000 mL | Freq: Once | INTRAVENOUS | Status: AC
  Administered 2011-12-20: 25 mL via INTRAVENOUS
  Filled 2011-12-20: qty 50

## 2011-12-20 MED ORDER — SODIUM CHLORIDE 0.9 % IV SOLN
Freq: Once | INTRAVENOUS | Status: AC
  Administered 2011-12-20: 15:00:00 via INTRAVENOUS

## 2011-12-20 NOTE — ED Provider Notes (Signed)
History     CSN: [id number]  Arrival date & time 12/20/11  1424   First MD Initiated Contact with Patient 12/20/11 1438      Chief Complaint  Patient presents with  . Headache    (Consider location/radiation/quality/duration/timing/severity/associated sxs/prior treatment) Patient is a 36 y.o. female presenting with headaches. The history is provided by the patient.  Headache   She had onset between 1 PM and 1:30 PM of a severe, dull bifrontal headache without radiation. About 30 minutes later, she developed numbness across her forehead and also in her right hand. She denied any numbness in her leg or other parts of her face. There is associated nausea with no vomiting. Lites seemed to make her headache worse. She noted some blurring of her vision which didn't change if she opened or closed either eye. Pain was initially 10 out of 10, but it is somewhat better now and is 8/10. She took 3 baby aspirin after the onset of the pain. She had a similar pain like this about a year ago, and one week later, she had a stroke which affected her speech.   Past Medical History  Diagnosis Date  . Asthma   . Anemia   . GERD (gastroesophageal reflux disease)   . COPD (chronic obstructive pulmonary disease)   . Dyslipidemia   . Goiter   . Chronic rhinitis   . Eczema   . TIA (transient ischemic attack) 11/2010    dx at [facility]  . Hyperlipidemia     Past Surgical History  Procedure Date  . Cesarean section     x2  . Tubal ligation     Family History  Problem Relation Age of Onset  . Asthma Mother   . Allergies Mother   . Lung cancer Maternal Grandmother     History  Substance Use Topics  . Smoking status: Former Smoker    Types: Cigarettes    Quit date: 09/17/2006  . 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 Alcohol Use: Not on file    OB History    Grav Para Term Preterm Abortions TAB SAB Ect Mult Living         Review of Systems  Neurological: Positive for headaches.  All other systems reviewed and are negative.    Allergies  Iodine and Iohexol  Home Medications   Current Outpatient Rx  Name Route Sig Dispense Refill  . ALBUTEROL SULFATE HFA 108 (90 BASE) MCG/ACT IN AERS Inhalation Inhale 2 puffs into the lungs every 4 (four) hours as needed. For wheezing    . ASPIRIN EC 81 MG PO TBEC Oral Take 81 mg by mouth daily.    . BECLOMETHASONE DIPROPIONATE 80 MCG/ACT IN AERS Inhalation Inhale 1 puff into the lungs daily as needed. For wheezing      BP 106/61  Pulse 88  Resp 22  SpO2 100%  Physical Exam  Nursing note and vitals reviewed.  36 year old female who is resting comfortably and in no acute distress. Vital signs are normal. Oxygen saturation is 100% which is normal. Head is normocephalic and atraumatic. Pupils are 3 mm and equal and reactive. Fundi are poorly visualized due to small pupils. EOMs are full. There is no facial asymmetry. Tongue protrudes in the midline. Neck is nontender and supple without adenopathy or bruit. Lungs are clear without rales, wheezes, or rhonchi. Heart has regular rate and rhythm without murmur. Abdomen is soft, flat,  nontender without masses or hepatosplenomegaly. Extremities have no cyanosis or edema, full range of motion is present. Skin is warm and dry without rash. Neurologic: She is awake and alert and oriented x3. Speech is normal and fluent. She is able to name objects accurately and able to repeat speech accurately. Cranial nerves are intact including normal sensation across the forehead. Motor strength is 5 out of 5 in both arms and both legs. There is minimal right pronator drift. Sensation is normal throughout and there is no extinction on double simultaneous stimulation. Finger to nose test is normal bilaterally.  ED Course  Procedures (including critical care time)  Results for orders placed during the hospital encounter of 12/20/11  CBC       Component Value Range   WBC 11.0 (*) 4.0 - 10.5 (K/uL)   RBC 4.81  3.87 - 5.11 (MIL/uL)   Hemoglobin 11.6 (*) 12.0 - 15.0 (g/dL)   HCT 16.1 (*) 09.6 - 46.0 (%)   MCV 73.4 (*) 78.0 - 100.0 (fL)   MCH 24.1 (*) 26.0 - 34.0 (pg)   MCHC 32.9  30.0 - 36.0 (g/dL)   RDW 04.5 (*) 40.9 - 15.5 (%)   Platelets 450 (*) 150 - 400 (K/uL)  DIFFERENTIAL      Component Value Range   Neutrophils Relative 65  43 - 77 (%)   Neutro Abs 7.2  1.7 - 7.7 (K/uL)   Lymphocytes Relative 22  12 - 46 (%)   Lymphs Abs 2.4  0.7 - 4.0 (K/uL)   Monocytes Relative 8  3 - 12 (%)   Monocytes Absolute 0.9  0.1 - 1.0 (K/uL)   Eosinophils Relative 5  0 - 5 (%)   Eosinophils Absolute 0.5  0.0 - 0.7 (K/uL)   Basophils Relative 0  0 - 1 (%)   Basophils Absolute 0.0  0.0 - 0.1 (K/uL)  BASIC METABOLIC PANEL      Component Value Range   Sodium 135  135 - 145 (mEq/L)   Potassium 3.5  3.5 - 5.1 (mEq/L)   Chloride 101  96 - 112 (mEq/L)   CO2 25  19 - 32 (mEq/L)   Glucose, Bld 62 (*) 70 - 99 (mg/dL)   BUN 12  6 - 23 (mg/dL)   Creatinine, Ser 8.11  0.50 - 1.10 (mg/dL)   Calcium 9.4  8.4 - 91.4 (mg/dL)   GFR calc non Af Amer >90  >90 (mL/min)   GFR calc Af Amer >90  >90 (mL/min)   Ct Head Wo Contrast  12/20/2011  *RADIOLOGY REPORT*  Clinical Data:  Headache  CT HEAD WITHOUT CONTRAST  Technique:  Contiguous axial images were obtained from the base of the skull through the vertex without contrast  Comparison:  02/08/2011  Findings:  The brain has a normal appearance without evidence for hemorrhage, acute infarction, hydrocephalus, or mass lesion.  There is no extra axial fluid collection.  The skull and paranasal sinuses are normal.  IMPRESSION: Normal CT of the head without contrast.  Original Report Authenticated By: Judie Petit. Ruel Favors, M.D.    Date: 12/20/2011  Rate: 65  Rhythm: normal sinus rhythm  QRS Axis: normal  Intervals: normal  ST/T Wave abnormalities: normal  Conduction Disutrbances:Incomplete right bundle-branch  block  Narrative Interpretation: Incomplete right bundle-branch block. When compared with ECG of 01/12/2011, no significant changes are seen.  Old EKG Reviewed: unchanged    After IV fluids, IV metoclopramide, and IV diphenhydramine the patient's headache and numbness have completely resolved.  Her current symptoms are clearly do to a headache which is probably a migraine variant. Blood sugar was noted to be borderline low and I did not feel was gone off to account for her neurologic symptoms. However, she was given dextrose intravenously to treat the low blood sugar. I reviewed her prior records and apparently she was diagnosed with a TIA at [facility]. I wonder at this point if her prior episode of TIA/stroke may have been a similar migraine headache. She is discharged with a prescription for metoclopramide to use as needed. She is to follow up with her PCP.  1. Migraine headache       MDM  Headache with subjective numbness. The fact that this is associated with nausea and with some photophobia makes me suspect a migraine variant. There is no evidence of a stroke at this point. CT scan will be obtained and she will receive a headache cocktail and reassessed.        Dione Booze, MD 12/20/11 1622

## 2011-12-20 NOTE — ED Notes (Signed)
Patient states she was eating lunch and had a sudden onset of headache  With vision problems and numbness in hands and feet.  Hx of stroke one year ago.  States she took 3 tablets of aspirin 81mg  each.  Took one 81 mg aspirin this am.

## 2011-12-20 NOTE — ED Notes (Signed)
The patient was taken back to room 14 and placed on the stretcher. She was then undressed and put into a gown. The EKG was performed and given to Dr. Preston Fleeting. She is on the monitor and having her V/S done every 15 minutes. The IV was started by the nurse and the blood draws done. She is now waiting for CT. The patient is comfortable and resting her family is at the bedside. The bed is locked and in the lowest position and the call light is within reach. Warm blankets have been given.

## 2011-12-20 NOTE — Discharge Instructions (Signed)
I believe that your headache is a type of migraine, and the numbness that you experienced is related to the migraine. It is possible that a similar process may have caused your or mini stroke last year. If you get a headache like this, try taking the metoclopramide tablets. However, any time you have symptoms that are suggestive of a stroke, come to the emergency department immediately so that she can be evaluated by an experienced physician.  Migraine Headache A migraine headache is an intense, throbbing pain on one or both sides of your head. The exact cause of a migraine headache is not always known. A migraine may be caused when nerves in the brain become irritated and release chemicals that cause swelling within blood vessels, causing pain. Many migraine sufferers have a family history of migraines. Before you get a migraine you may or may not get an aura. An aura is a group of symptoms that can predict the beginning of a migraine. An aura may include:  Visual changes such as:   Flashing lights.   Bright spots or zig-zag lines.   Tunnel vision.   Feelings of numbness.   Trouble talking.   Muscle weakness.  SYMPTOMS  Pain on one or both sides of your head.   Pain that is pulsating or throbbing in nature.   Pain that is severe enough to prevent daily activities.   Pain that is aggravated by any daily physical activity.   Nausea (feeling sick to your stomach), vomiting, or both.   Pain with exposure to bright lights, loud noises, or activity.   General sensitivity to bright lights or loud noises.  MIGRAINE TRIGGERS Examples of triggers of migraine headaches include:   Alcohol.   Smoking.   Stress.   It may be related to menses (female menstruation).   Aged cheeses.   Foods or drinks that contain nitrates, glutamate, aspartame, or tyramine.   Lack of sleep.   Chocolate.   Caffeine.   Hunger.   Medications such as nitroglycerine (used to treat chest pain),  birth control pills, estrogen, and some blood pressure medications.  DIAGNOSIS  A migraine headache is often diagnosed based on:  Symptoms.   Physical examination.   A computerized X-ray scan (computed tomography, CT) of your head.  TREATMENT  Medications can help prevent migraines if they are recurrent or should they become recurrent. Your caregiver can help you with a medication or treatment program that will be helpful to you.   Lying down in a dark, quiet room may be helpful.   Keeping a headache diary may help you find a trend as to what may be triggering your headaches.  SEEK IMMEDIATE MEDICAL CARE IF:   You have confusion, personality changes or seizures.   You have headaches that wake you from sleep.   You have an increased frequency in your headaches.   You have a stiff neck.   You have a loss of vision.   You have muscle weakness.   You start losing your balance or have trouble walking.   You feel faint or pass out.  MAKE SURE YOU:   Understand these instructions.   Will watch your condition.   Will get help right away if you are not doing well or get worse.  Document Released: 09/03/2005 Document Revised: 08/23/2011 Document Reviewed: 04/19/2009 [facility] Patient Information 2012 [place], [place].  Metoclopramide tablets What is this medicine? METOCLOPRAMIDE (met oh kloe PRA mide) is used to treat the symptoms of gastroesophageal reflux  disease (GERD) like heartburn. It is also used to treat people with slow emptying of the stomach and intestinal tract. This medicine may be used for other purposes; ask your health care provider or pharmacist if you have questions. What should I tell my health care provider before I take this medicine? They need to know if you have any of these conditions: -breast cancer -depression -diabetes -heart failure -high blood pressure -kidney disease -liver disease -Parkinson's disease or a movement  disorder -pheochromocytoma -seizures -stomach obstruction, bleeding, or perforation -an unusual or allergic reaction to metoclopramide, procainamide, sulfites, other medicines, foods, dyes, or preservatives -pregnant or trying to get pregnant -breast-feeding How should I use this medicine? Take this medicine by mouth with a glass of water. Follow the directions on the prescription label. Take this medicine on an empty stomach, about 30 minutes before eating. Take your doses at regular intervals. Do not take your medicine more often than directed. Do not stop taking except on the advice of your doctor or health care professional. A special MedGuide will be given to you by the pharmacist with each prescription and refill. Be sure to read this information carefully each time. Talk to your pediatrician regarding the use of this medicine in children. Special care may be needed. Overdosage: If you think you have taken too much of this medicine contact a poison control center or emergency room at once. NOTE: This medicine is only for you. Do not share this medicine with others. What if I miss a dose? If you miss a dose, take it as soon as you can. If it is almost time for your next dose, take only that dose. Do not take double or extra doses. What may interact with this medicine? -acetaminophen -cyclosporine -digoxin -medicines for blood pressure -medicines for diabetes, including insulin -medicines for hay fever and other allergies -medicines for depression, especially an Monoamine Oxidase Inhibitor (MAOI) -medicines for Parkinson's disease, like levodopa -medicines for sleep or for pain -tetracycline This list may not describe all possible interactions. Give your health care provider a list of all the medicines, herbs, non-prescription drugs, or dietary supplements you use. Also tell them if you smoke, drink alcohol, or use illegal drugs. Some items may interact with your medicine. What should  I watch for while using this medicine? It may take a few weeks for your stomach condition to start to get better. However, do not take this medicine for longer than 12 weeks. The longer you take this medicine, and the more you take it, the greater your chances are of developing serious side effects. If you are an elderly patient, a female patient, or you have diabetes, you may be at an increased risk for side effects from this medicine. Contact your doctor immediately if you start having movements you cannot control such as lip smacking, rapid movements of the tongue, involuntary or uncontrollable movements of the eyes, head, arms and legs, or muscle twitches and spasms. Patients and their families should watch out for worsening depression or thoughts of suicide. Also watch out for any sudden or severe changes in feelings such as feeling anxious, agitated, panicky, irritable, hostile, aggressive, impulsive, severely restless, overly excited and hyperactive, or not being able to sleep. If this happens, especially at the beginning of treatment or after a change in dose, call your doctor. Do not treat yourself for high fever. Ask your doctor or health care professional for advice. You may get drowsy or dizzy. Do not drive, use machinery, or  do anything that needs mental alertness until you know how this drug affects you. Do not stand or sit up quickly, especially if you are an older patient. This reduces the risk of dizzy or fainting spells. Alcohol can make you more drowsy and dizzy. Avoid alcoholic drinks. What side effects may I notice from receiving this medicine? Side effects that you should report to your doctor or health care professional as soon as possible: -allergic reactions like skin rash, itching or hives, swelling of the face, lips, or tongue -abnormal production of milk in females -breast enlargement in both males and females -change in the way you walk -difficulty moving, speaking or  swallowing -drooling, lip smacking, or rapid movements of the tongue -excessive sweating -fever -involuntary or uncontrollable movements of the eyes, head, arms and legs -irregular heartbeat or palpitations -muscle twitches and spasms -unusually weak or tired Side effects that usually do not require medical attention (report to your doctor or health care professional if they continue or are bothersome): -change in sex drive or performance -depressed mood -diarrhea -difficulty sleeping -headache -menstrual changes -restless or nervous This list may not describe all possible side effects. Call your doctor for medical advice about side effects. You may report side effects to FDA at 1-800-FDA-1088. Where should I keep my medicine? Keep out of the reach of children. Store at room temperature between 20 and 25 degrees C (68 and 77 degrees F). Protect from light. Keep container tightly closed. Throw away any unused medicine after the expiration date. NOTE: This sheet is a summary. It may not cover all possible information. If you have questions about this medicine, talk to your doctor, pharmacist, or health care provider.  2012, [employer]. (04/28/2008 4:30:05 PM)

## 2011-12-20 NOTE — ED Notes (Signed)
Pt transported to Room 14.  Luella Cook, RN Charge Nurse informed.

## 2011-12-20 NOTE — ED Notes (Signed)
Pt reports onset of headache, confusion and right hand numbness 1 hour PTA. Hx TIA last year

## 2011-12-21 ENCOUNTER — Encounter (HOSPITAL_BASED_OUTPATIENT_CLINIC_OR_DEPARTMENT_OTHER): Payer: Self-pay | Admitting: *Deleted

## 2012-03-24 ENCOUNTER — Telehealth: Payer: Self-pay | Admitting: Internal Medicine

## 2012-03-24 NOTE — Telephone Encounter (Signed)
Fine - any - thanks

## 2012-03-24 NOTE — Telephone Encounter (Signed)
The pt is hoping to be seen for a cpe and for a lump under her arm.  She has not

## 2012-03-24 NOTE — Telephone Encounter (Signed)
The pt is hoping to be worked in sooner for a cpe and to check a lump under her arm.  Her last office visit was 04/06/11 for anxiety, and her last cpe was 05/02/2010.  She was offered a cpe in September, but refused stating she had to be seen sooner.  Do you want her worked in for a cpe?   Thanks!

## 2012-06-04 ENCOUNTER — Encounter (HOSPITAL_COMMUNITY): Payer: Self-pay | Admitting: Family Medicine

## 2012-06-04 ENCOUNTER — Emergency Department (HOSPITAL_COMMUNITY): Payer: Medicaid Other

## 2012-06-04 ENCOUNTER — Emergency Department (HOSPITAL_COMMUNITY)
Admission: EM | Admit: 2012-06-04 | Discharge: 2012-06-04 | Disposition: A | Discharge status: Home / Self Care | Payer: Medicaid Other | Attending: Emergency Medicine | Admitting: Emergency Medicine

## 2012-06-04 DIAGNOSIS — K219 Gastro-esophageal reflux disease without esophagitis: Secondary | ICD-10-CM | POA: Insufficient documentation

## 2012-06-04 DIAGNOSIS — G459 Transient cerebral ischemic attack, unspecified: Secondary | ICD-10-CM | POA: Insufficient documentation

## 2012-06-04 DIAGNOSIS — R930 Abnormal findings on diagnostic imaging of skull and head, not elsewhere classified: Secondary | ICD-10-CM | POA: Insufficient documentation

## 2012-06-04 DIAGNOSIS — R9082 White matter disease, unspecified: Secondary | ICD-10-CM

## 2012-06-04 DIAGNOSIS — H539 Unspecified visual disturbance: Secondary | ICD-10-CM | POA: Insufficient documentation

## 2012-06-04 DIAGNOSIS — J4489 Other specified chronic obstructive pulmonary disease: Secondary | ICD-10-CM | POA: Insufficient documentation

## 2012-06-04 DIAGNOSIS — E785 Hyperlipidemia, unspecified: Secondary | ICD-10-CM | POA: Insufficient documentation

## 2012-06-04 DIAGNOSIS — Z79899 Other long term (current) drug therapy: Secondary | ICD-10-CM | POA: Insufficient documentation

## 2012-06-04 DIAGNOSIS — Z87891 Personal history of nicotine dependence: Secondary | ICD-10-CM | POA: Insufficient documentation

## 2012-06-04 DIAGNOSIS — Z7982 Long term (current) use of aspirin: Secondary | ICD-10-CM | POA: Insufficient documentation

## 2012-06-04 DIAGNOSIS — J449 Chronic obstructive pulmonary disease, unspecified: Secondary | ICD-10-CM | POA: Insufficient documentation

## 2012-06-04 LAB — CBC
MCHC: 32.5 g/dL (ref 30.0–36.0)
RDW: 15.1 % (ref 11.5–15.5)
WBC: 11.2 10*3/uL — ABNORMAL HIGH (ref 4.0–10.5)

## 2012-06-04 LAB — URINALYSIS, ROUTINE W REFLEX MICROSCOPIC
Nitrite: NEGATIVE
Specific Gravity, Urine: 1.022 (ref 1.005–1.030)
Urobilinogen, UA: 1 mg/dL (ref 0.0–1.0)

## 2012-06-04 LAB — POCT PREGNANCY, URINE: Preg Test, Ur: NEGATIVE

## 2012-06-04 LAB — URINE MICROSCOPIC-ADD ON

## 2012-06-04 LAB — BASIC METABOLIC PANEL
GFR calc Af Amer: >90 mL/min (ref >90)
GFR calc non Af Amer: >90 mL/min (ref >90)
Potassium: 4.4 mEq/L (ref 3.5–5.1)
Sodium: 136 mEq/L (ref 135–145)

## 2012-06-04 MED ORDER — SODIUM CHLORIDE 0.9 % IV BOLUS (SEPSIS)
1000.0000 mL | Freq: Once | INTRAVENOUS | Status: AC
  Administered 2012-06-04: 1000 mL via INTRAVENOUS

## 2012-06-04 NOTE — ED Notes (Signed)
Pt transported to CT ?

## 2012-06-04 NOTE — ED Notes (Signed)
Pt reports feeling very anxious and nervous.

## 2012-06-04 NOTE — ED Notes (Signed)
Pt sts about 1130 that she loss part of her vision and then it returned. sts she is having numbness and pain over right eye and fatigue. Grips equal. No facial droop noted. No slurred speech. Hx of migraines.

## 2012-06-04 NOTE — ED Provider Notes (Signed)
History     CSN: [id number]  Arrival date & time 06/04/12  1302   First MD Initiated Contact with Patient 06/04/12 1315      Chief Complaint  Patient presents with  . Numbness  . Loss of Vision    (Consider location/radiation/quality/duration/timing/severity/associated sxs/prior treatment) HPI The patient has history of TIA.  She notes that today, several hours ago she developed a left-sided headache, gradually.  The apex of the pain she had left sided peripheral visual loss.  This resolved spontaneously.  She subsequently developed a right-sided headache with no new visual loss.  This also resolved spontaneously.  The patient denies any current complaints. She notes over the course the day, preceding her changes, she's felt generally unwell, with mild nausea. She denies any recent medication changes. After her symptoms began she took 3 additional baby aspirin, which is beyond her 1 atypical 81 mg daily dose. Past Medical History  Diagnosis Date  . TIA (transient ischemic attack)   . Asthma   . Anemia   . GERD (gastroesophageal reflux disease)   . COPD (chronic obstructive pulmonary disease)   . Dyslipidemia   . Goiter   . Chronic rhinitis   . Eczema   . TIA (transient ischemic attack) 11/2010    dx at [facility]  . Hyperlipidemia     Past Surgical History  Procedure Date  . Cesarean section     x2  . Tubal ligation     Family History  Problem Relation Age of Onset  . Asthma Mother   . Allergies Mother   . Lung cancer Maternal Grandmother     History  Substance Use Topics  . Smoking status: Former Smoker    Types: Cigarettes    Quit date: 09/17/2006  . 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 Alcohol Use:     OB History    Grav Para Term Preterm Abortions TAB SAB Ect Mult Living                  Review of Systems  Constitutional:       HPI  HENT:       HPI otherwise negative  Eyes:  Negative.   Respiratory:       HPI, otherwise negative  Cardiovascular:       HPI, otherwise nmegative  Gastrointestinal: Negative for vomiting.  Genitourinary:       HPI, otherwise negative  Musculoskeletal:       HPI, otherwise negative  Skin: Negative.   Neurological: Negative for syncope.    Allergies  Iodine and Iohexol  Home Medications   Current Outpatient Rx  Name Route Sig Dispense Refill  . ALBUTEROL SULFATE HFA 108 (90 BASE) MCG/ACT IN AERS Inhalation Inhale 2 puffs into the lungs every 4 (four) hours as needed. For wheezing    . ASPIRIN EC 81 MG PO TBEC Oral Take 81 mg by mouth daily.    . TRIAMCINOLONE ACETONIDE EX Apply externally Apply 1 application topically 2 (two) times daily as needed. For eczema      BP 106/70  Pulse 72  Temp 98.2 F (36.8 C) (Oral)  Resp 20  SpO2 100%  LMP 05/19/2012  Physical Exam  Nursing note and vitals reviewed. Constitutional: She is oriented to person, place, and time. She appears well-developed and well-nourished. No distress.  HENT:  Head: Normocephalic and atraumatic.  Eyes: Conjunctivae normal and EOM are normal.  Cardiovascular: Normal rate and regular rhythm.   Pulmonary/Chest: Effort normal and breath sounds normal. No stridor. No respiratory distress.  Abdominal: She exhibits no distension.  Musculoskeletal: She exhibits no edema.  Neurological: She is alert and oriented to person, place, and time. She displays no atrophy and no tremor. No cranial nerve deficit or sensory deficit. She exhibits normal muscle tone. She displays no seizure activity. Coordination normal.  Skin: Skin is warm and dry.  Psychiatric: She has a normal mood and affect.    ED Course  Procedures (including critical care time)   Labs Reviewed  CBC  BASIC METABOLIC PANEL  URINALYSIS, ROUTINE W REFLEX MICROSCOPIC   No results found.   No diagnosis found.  Cardiac 75 sinus rhythm normal Pulse ox 100% room air normal   MDM  The  patient presents after a transient headache with visual changes.  On my exam she is asymptomatic, denies any complaints at all.  She is no discernible neurologic findings.  However, given the patient's history of TIA, there is some suspicion for this entity.  The patient's initial labs, radiographic studies are reassuring.  Given her history, she will have a MRI for additional evaluation.  The patient was transferred to the CDU for completion of her care.  I discussed the case with the mid-level provider Scheryl Darter, MD 06/04/12 1535

## 2012-06-04 NOTE — ED Notes (Addendum)
Pt reports sudden vision loss at 11:30am in left eye.  Associated with headache on right side and numbness/tingling to right side of face.  Pt reports nausea and sweating.  Pt took one 81mg  aspirin this morning and 3 after headache started.  Pt alert oriented X4.  Pt has history of migraines but never vision or sensation with them. RN notes no facial droop and neuro assess WNL.

## 2012-06-04 NOTE — ED Provider Notes (Signed)
Patient signed out to me by Dr Jeraldine Loots.  Pt to move to CDU holding for MRI brain.  Pt with hx TIAs, similar symptoms today.  Plan is for MRI brain.  If negative, pt to be d/c home with neurology follow up (pt has a neurologist, non-local).  If positive, will discuss with on call neurology.    5:39 PM Pt currently in MRI  6:51 PM Pt has returned from MRI.  Pt states her headache has resolved, denies any visual deficits or focal neurological deficits.  On exam, pt is A&O, NAD. CN II-XII intact, EOMs intact, no pronator drift, grip strengths equal bilaterally; strength 5/5 in all extremities, sensation intact in all extremities; finger to nose, heel to shin, rapid alternating movements normal; gait is normal.  Awaiting MRI results. Pt states she was seen by a neurologist in [facility] but has never followed up.  Will give neurologist in [place].    8:17 PM MRI shows minimal nonspecific white matter changes, no acute infarct.  Discussed MRI results with patient, encouraged patient to follow up with neurology.  Pt continues to be asymptomatic.  Plan is for d/c home with neuro follow up.  Pt given return precautions.  Pt verbalizes understanding and agrees with plan.      Results for orders placed during the hospital encounter of 06/04/12  CBC      Component Value Range   WBC 11.2 (*) 4.0 - 10.5 K/uL   RBC 4.48  3.87 - 5.11 MIL/uL   Hemoglobin 10.9 (*) 12.0 - 15.0 g/dL   HCT 56.2 (*) 13.0 - 86.5 %   MCV 74.8 (*) 78.0 - 100.0 fL   MCH 24.3 (*) 26.0 - 34.0 pg   MCHC 32.5  30.0 - 36.0 g/dL   RDW 78.4  69.6 - 29.5 %   Platelets 343  150 - 400 K/uL  BASIC METABOLIC PANEL      Component Value Range   Sodium 136  135 - 145 mEq/L   Potassium 4.4  3.5 - 5.1 mEq/L   Chloride 101  96 - 112 mEq/L   CO2 24  19 - 32 mEq/L   Glucose, Bld 87  70 - 99 mg/dL   BUN 11  6 - 23 mg/dL   Creatinine, Ser 2.84  0.50 - 1.10 mg/dL   Calcium 9.6  8.4 - 13.2 mg/dL   GFR calc non Af Amer >90  >90 mL/min   GFR calc  Af Amer >90  >90 mL/min  URINALYSIS, ROUTINE W REFLEX MICROSCOPIC      Component Value Range   Color, Urine YELLOW  YELLOW   APPearance CLEAR  CLEAR   Specific Gravity, Urine 1.022  1.005 - 1.030   pH 6.5  5.0 - 8.0   Glucose, UA NEGATIVE  NEGATIVE mg/dL   Hgb urine dipstick NEGATIVE  NEGATIVE   Bilirubin Urine NEGATIVE  NEGATIVE   Ketones, ur 15 (*) NEGATIVE mg/dL   Protein, ur NEGATIVE  NEGATIVE mg/dL   Urobilinogen, UA 1.0  0.0 - 1.0 mg/dL   Nitrite NEGATIVE  NEGATIVE   Leukocytes, UA SMALL (*) NEGATIVE  POCT PREGNANCY, URINE      Component Value Range   Preg Test, Ur NEGATIVE  NEGATIVE  URINE MICROSCOPIC-ADD ON      Component Value Range   Squamous Epithelial / LPF MANY (*) RARE   WBC, UA 0-2  <3 WBC/hpf   Urine-Other MUCOUS PRESENT     Ct Head Wo Contrast  06/04/2012  *  RADIOLOGY REPORT*  Clinical Data: 36 year old female with acute onset of vision changes.  Numbness and pain over the right eye with fatigue.  CT HEAD WITHOUT CONTRAST  Technique:  Contiguous axial images were obtained from the base of the skull through the vertex without contrast.  Comparison: 12/20/2011 and earlier.  Findings: Visualized paranasal sinuses and mastoids are clear.  No acute osseous abnormality identified.  Visualized scalp soft tissues are within normal limits.  Stable and normal visualized orbit soft tissues.  The normal cerebral volume.  No ventriculomegaly. No midline shift, mass effect, or evidence of mass lesion.  Gray-white matter differentiation is within normal limits throughout the brain.  No acute intracranial hemorrhage identified.  No suspicious intracranial vascular hyperdensity. No evidence of cortically based acute infarction identified.  IMPRESSION: Stable and normal noncontrast CT appearance of the brain.   Original Report Authenticated By: Harley Hallmark, M.D.    Mr Brain Wo Contrast  06/04/2012  *RADIOLOGY REPORT*  Clinical Data: Left sided headache with left-sided peripheral visual  loss.  Symptoms resolved.  Then patient develop right-sided headache.  This resolved.  Mild nausea.  History of prior TIA. Hyperlipidemia.  Migraine headaches.  MRI HEAD WITHOUT CONTRAST  Technique:  Multiplanar, multiecho pulse sequences of the brain and surrounding structures were obtained according to standard protocol without intravenous contrast.  Comparison: 06/04/2012 CT.  No comparison MR.  Findings: No acute infarct.  No intracranial hemorrhage.  No intracranial mass lesion detected on this unenhanced exam.  Two very small areas of altered signal intensity involving the left temporal lobe and left frontal lobe nonspecific with regard to etiology.  It is possible these represent areas of small vessel disease type changes in this hyperlipidemic patient.  Result of migraine headaches not excluded.  Given the patient's age and gender, demyelinating process cannot be excluded.  Other causes for white matter type changes felt to be less likely.  Cerebellar tonsils minimally low-lying although within the range of normal limits.  Partially empty sella may be an incidental finding this is also been described in patients with pseudotumor cerebri.  No secondary findings of such.  Minimal paranasal sinus mucosal thickening.  Diminutive size right vertebral artery may end in a PICA distribution and the.  Major intracranial vascular structures are patent.  IMPRESSION: No acute infarct.  Minimal nonspecific white matter type changes left hemisphere as noted above.   Original Report Authenticated By: Fuller Canada, M.D.       [place], [place] 06/04/12 2048

## 2012-06-05 NOTE — ED Provider Notes (Signed)
This patient was initially seen by me in the ED.  Please see my initial note.  I was available through the early portion of the patient's CDU care as well.   Gerhard Munch, MD 06/05/12 [phone]

## 2012-06-24 ENCOUNTER — Emergency Department (HOSPITAL_COMMUNITY)
Admission: EM | Admit: 2012-06-24 | Discharge: 2012-06-24 | Discharge status: Against Medical Advice | Payer: Medicaid Other | Attending: Emergency Medicine | Admitting: Emergency Medicine

## 2012-06-24 ENCOUNTER — Encounter (HOSPITAL_COMMUNITY): Payer: Self-pay | Admitting: Emergency Medicine

## 2012-06-24 DIAGNOSIS — R51 Headache: Secondary | ICD-10-CM | POA: Insufficient documentation

## 2012-06-24 DIAGNOSIS — R209 Unspecified disturbances of skin sensation: Secondary | ICD-10-CM | POA: Insufficient documentation

## 2012-06-24 DIAGNOSIS — J3489 Other specified disorders of nose and nasal sinuses: Secondary | ICD-10-CM | POA: Insufficient documentation

## 2012-06-24 NOTE — ED Notes (Signed)
Patient states that she wants to go somewhere else to be seen. Advised patient about waiting time and triaging patients.  Told patient that we would like see her and evaluate her in the ED but she decided she wants to go somewhere else.  Explained to patient that if symptoms worsen to come back as soon as possible.  Patient is alert and oriented x 3.  Neuro intact.  Ambulatory.  Left with her husband

## 2012-06-24 NOTE — ED Notes (Signed)
PT. ARRIVED WITH EMS FROM HOME REPORTS RIGHT SIDE HEADACHE ONSET THIS EVENING 7 PM / BRIEF BILATERAL HAND NUMBNESS / COLD SX. NASAL CONGESTION .

## 2012-12-04 ENCOUNTER — Emergency Department (HOSPITAL_BASED_OUTPATIENT_CLINIC_OR_DEPARTMENT_OTHER)
Admission: EM | Admit: 2012-12-04 | Discharge: 2012-12-04 | Disposition: A | Discharge status: Home / Self Care | Payer: Medicaid Other | Attending: Emergency Medicine | Admitting: Emergency Medicine

## 2012-12-04 ENCOUNTER — Emergency Department (HOSPITAL_BASED_OUTPATIENT_CLINIC_OR_DEPARTMENT_OTHER): Payer: Medicaid Other

## 2012-12-04 ENCOUNTER — Encounter (HOSPITAL_BASED_OUTPATIENT_CLINIC_OR_DEPARTMENT_OTHER): Payer: Self-pay

## 2012-12-04 DIAGNOSIS — Z87891 Personal history of nicotine dependence: Secondary | ICD-10-CM | POA: Insufficient documentation

## 2012-12-04 DIAGNOSIS — Z8639 Personal history of other endocrine, nutritional and metabolic disease: Secondary | ICD-10-CM | POA: Insufficient documentation

## 2012-12-04 DIAGNOSIS — Z8709 Personal history of other diseases of the respiratory system: Secondary | ICD-10-CM | POA: Insufficient documentation

## 2012-12-04 DIAGNOSIS — J449 Chronic obstructive pulmonary disease, unspecified: Secondary | ICD-10-CM | POA: Insufficient documentation

## 2012-12-04 DIAGNOSIS — Z8673 Personal history of transient ischemic attack (TIA), and cerebral infarction without residual deficits: Secondary | ICD-10-CM | POA: Insufficient documentation

## 2012-12-04 DIAGNOSIS — Z7902 Long term (current) use of antithrombotics/antiplatelets: Secondary | ICD-10-CM | POA: Insufficient documentation

## 2012-12-04 DIAGNOSIS — R51 Headache: Secondary | ICD-10-CM | POA: Insufficient documentation

## 2012-12-04 DIAGNOSIS — L259 Unspecified contact dermatitis, unspecified cause: Secondary | ICD-10-CM | POA: Insufficient documentation

## 2012-12-04 DIAGNOSIS — Z862 Personal history of diseases of the blood and blood-forming organs and certain disorders involving the immune mechanism: Secondary | ICD-10-CM | POA: Insufficient documentation

## 2012-12-04 DIAGNOSIS — R059 Cough, unspecified: Secondary | ICD-10-CM | POA: Insufficient documentation

## 2012-12-04 DIAGNOSIS — Z8719 Personal history of other diseases of the digestive system: Secondary | ICD-10-CM | POA: Insufficient documentation

## 2012-12-04 DIAGNOSIS — Z79899 Other long term (current) drug therapy: Secondary | ICD-10-CM | POA: Insufficient documentation

## 2012-12-04 DIAGNOSIS — J4489 Other specified chronic obstructive pulmonary disease: Secondary | ICD-10-CM | POA: Insufficient documentation

## 2012-12-04 MED ORDER — PREDNISONE 10 MG PO TABS: 20.0000 mg | ORAL_TABLET | Freq: Every day | ORAL | Status: AC

## 2012-12-04 MED ORDER — ALBUTEROL SULFATE (2.5 MG/3ML) 0.083% IN NEBU: 2.5000 mg | INHALATION_SOLUTION | RESPIRATORY_TRACT | Status: AC | PRN

## 2012-12-04 MED ORDER — PREDNISONE 50 MG PO TABS
60.0000 mg | ORAL_TABLET | Freq: Once | ORAL | Status: AC
  Administered 2012-12-04: 60 mg via ORAL
  Filled 2012-12-04: qty 1

## 2012-12-04 MED ORDER — HYDROCOD POLST-CHLORPHEN POLST 10-8 MG/5ML PO LQCR: 5.0000 mL | Freq: Two times a day (BID) | ORAL | Status: DC | PRN

## 2012-12-04 MED ORDER — ALBUTEROL SULFATE (5 MG/ML) 0.5% IN NEBU
2.5000 mg | INHALATION_SOLUTION | RESPIRATORY_TRACT | Status: AC
  Administered 2012-12-04: 2.5 mg via RESPIRATORY_TRACT
  Filled 2012-12-04: qty 0.5

## 2012-12-04 NOTE — ED Provider Notes (Addendum)
History     CSN: [id number]  Arrival date & time 12/04/12  1740   First MD Initiated Contact with Patient 12/04/12 1808      Chief Complaint  Patient presents with  . Cough  . Headache    (Consider location/radiation/quality/duration/timing/severity/associated sxs/prior treatment) HPI  The patient presents with concerns of ongoing cough, generalized discomfort.  Symptoms began yesterday without clear precipitant.  Since onset symptoms are transiently been relieved with albuterol inhaler.  No concurrent significant chest pain, headache, fever, vomiting, diarrhea, rash. The patient does note that her eczema has recurred The patient's sister notes that the patient has had no disorientation, confusion, change in behavior.  Past Medical History  Diagnosis Date  . TIA (transient ischemic attack)   . Asthma   . Anemia   . GERD (gastroesophageal reflux disease)   . COPD (chronic obstructive pulmonary disease)   . Dyslipidemia   . Goiter   . Chronic rhinitis   . Eczema   . TIA (transient ischemic attack) 11/2010    dx at [facility]  . Hyperlipidemia     Past Surgical History  Procedure Laterality Date  . Cesarean section      x2  . Tubal ligation      Family History  Problem Relation Age of Onset  . Asthma Mother   . Allergies Mother   . Lung cancer Maternal Grandmother     History  Substance Use Topics  . Smoking status: Former Smoker    Types: Cigarettes    Quit date: 09/17/2006  . 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 Alcohol Use: Yes    OB History   Grav Para Term Preterm Abortions TAB SAB Ect Mult Living                  Review of Systems  Constitutional:       Per HPI, otherwise negative  HENT:       Per HPI, otherwise negative  Respiratory:       Per HPI, otherwise negative  Cardiovascular:       Per HPI, otherwise negative  Gastrointestinal: Negative for vomiting.  Endocrine:        Negative aside from HPI  Genitourinary:       Neg aside from HPI   Musculoskeletal:       Per HPI, otherwise negative  Skin: Negative.   Neurological: Negative for syncope.    Allergies  Iodine and Iohexol  Home Medications   Current Outpatient Rx  Name  Route  Sig  Dispense  Refill  . clopidogrel (PLAVIX) 75 MG tablet   Oral   Take 75 mg by mouth daily.         Marland Kitchen albuterol (VENTOLIN HFA) 108 (90 BASE) MCG/ACT inhaler   Inhalation   Inhale 2 puffs into the lungs every 4 (four) hours as needed. For wheezing           BP 102/65  Pulse 98  Temp(Src) 98.1 F (36.7 C) (Oral)  Resp 18  Ht 5' (1.524 m)  Wt 155 lb (70.308 kg)  BMI 30.27 kg/m2  SpO2 100%  LMP 12/04/2012  Physical Exam  Nursing note and vitals reviewed. Constitutional: She is oriented to person, place, and time. She appears well-developed and well-nourished. No distress.  HENT:  Head: Normocephalic and atraumatic.  Eyes: Conjunctivae and EOM are normal.  Cardiovascular: Normal rate and regular rhythm.  Pulmonary/Chest: Effort normal and breath sounds normal. No stridor. No respiratory distress.  Abdominal: She exhibits no distension.  Musculoskeletal: She exhibits no edema.  Neurological: She is alert and oriented to person, place, and time. No cranial nerve deficit.  Skin: Skin is warm and dry.  Psychiatric: She has a normal mood and affect.    ED Course  Procedures (including critical care time)  Labs Reviewed - No data to display No results found.   No diagnosis found.  O2- 99% ra, normal  7:03 PM She was informed of all results.    Patient requests neb refill.   MDM  This young female presents with ongoing cough, headache, generalized complaints.  On exam she is not hypoxic or tachypneic.  Given her history of COPD/asthma, and the patient's verbalized concern for pneumonia she had a chest x-ray performed.  This was unremarkable.  With her history she was started on steroids,  discharged in stable condition to follow up with her primary care physician.  Gerhard Munch, MD 12/04/12 1851  Gerhard Munch, MD 12/04/12 [phone]

## 2012-12-04 NOTE — ED Notes (Signed)
Pt reports a productive cough and headache x 2 days.

## 2013-04-09 ENCOUNTER — Other Ambulatory Visit: Payer: Self-pay | Admitting: Obstetrics and Gynecology

## 2013-05-04 ENCOUNTER — Emergency Department (HOSPITAL_BASED_OUTPATIENT_CLINIC_OR_DEPARTMENT_OTHER)
Admission: EM | Admit: 2013-05-04 | Discharge: 2013-05-04 | Disposition: A | Discharge status: Home / Self Care | Payer: Medicaid Other | Attending: Emergency Medicine | Admitting: Emergency Medicine

## 2013-05-04 ENCOUNTER — Encounter (HOSPITAL_BASED_OUTPATIENT_CLINIC_OR_DEPARTMENT_OTHER): Payer: Self-pay | Admitting: *Deleted

## 2013-05-04 DIAGNOSIS — Z8639 Personal history of other endocrine, nutritional and metabolic disease: Secondary | ICD-10-CM | POA: Insufficient documentation

## 2013-05-04 DIAGNOSIS — R3989 Other symptoms and signs involving the genitourinary system: Secondary | ICD-10-CM | POA: Insufficient documentation

## 2013-05-04 DIAGNOSIS — N390 Urinary tract infection, site not specified: Secondary | ICD-10-CM

## 2013-05-04 DIAGNOSIS — J020 Streptococcal pharyngitis: Secondary | ICD-10-CM | POA: Insufficient documentation

## 2013-05-04 DIAGNOSIS — Z8719 Personal history of other diseases of the digestive system: Secondary | ICD-10-CM | POA: Insufficient documentation

## 2013-05-04 DIAGNOSIS — Z87891 Personal history of nicotine dependence: Secondary | ICD-10-CM | POA: Insufficient documentation

## 2013-05-04 DIAGNOSIS — R599 Enlarged lymph nodes, unspecified: Secondary | ICD-10-CM | POA: Insufficient documentation

## 2013-05-04 DIAGNOSIS — R509 Fever, unspecified: Secondary | ICD-10-CM | POA: Insufficient documentation

## 2013-05-04 DIAGNOSIS — Z3202 Encounter for pregnancy test, result negative: Secondary | ICD-10-CM | POA: Insufficient documentation

## 2013-05-04 DIAGNOSIS — Z862 Personal history of diseases of the blood and blood-forming organs and certain disorders involving the immune mechanism: Secondary | ICD-10-CM | POA: Insufficient documentation

## 2013-05-04 DIAGNOSIS — J45909 Unspecified asthma, uncomplicated: Secondary | ICD-10-CM | POA: Insufficient documentation

## 2013-05-04 DIAGNOSIS — Z7902 Long term (current) use of antithrombotics/antiplatelets: Secondary | ICD-10-CM | POA: Insufficient documentation

## 2013-05-04 DIAGNOSIS — J449 Chronic obstructive pulmonary disease, unspecified: Secondary | ICD-10-CM | POA: Insufficient documentation

## 2013-05-04 DIAGNOSIS — R059 Cough, unspecified: Secondary | ICD-10-CM | POA: Insufficient documentation

## 2013-05-04 DIAGNOSIS — R05 Cough: Secondary | ICD-10-CM | POA: Insufficient documentation

## 2013-05-04 DIAGNOSIS — J4489 Other specified chronic obstructive pulmonary disease: Secondary | ICD-10-CM | POA: Insufficient documentation

## 2013-05-04 DIAGNOSIS — R300 Dysuria: Secondary | ICD-10-CM | POA: Insufficient documentation

## 2013-05-04 DIAGNOSIS — Z8673 Personal history of transient ischemic attack (TIA), and cerebral infarction without residual deficits: Secondary | ICD-10-CM | POA: Insufficient documentation

## 2013-05-04 DIAGNOSIS — Z8709 Personal history of other diseases of the respiratory system: Secondary | ICD-10-CM | POA: Insufficient documentation

## 2013-05-04 DIAGNOSIS — Z872 Personal history of diseases of the skin and subcutaneous tissue: Secondary | ICD-10-CM | POA: Insufficient documentation

## 2013-05-04 LAB — URINE MICROSCOPIC-ADD ON

## 2013-05-04 LAB — URINALYSIS, ROUTINE W REFLEX MICROSCOPIC
Bilirubin Urine: NEGATIVE
Glucose, UA: NEGATIVE mg/dL
Hgb urine dipstick: NEGATIVE
Ketones, ur: NEGATIVE mg/dL
Protein, ur: NEGATIVE mg/dL
Urobilinogen, UA: 1 mg/dL (ref 0.0–1.0)

## 2013-05-04 MED ORDER — CEPHALEXIN 500 MG PO CAPS: 500.0000 mg | ORAL_CAPSULE | Freq: Four times a day (QID) | ORAL | Status: DC

## 2013-05-04 MED ORDER — PENICILLIN G BENZATHINE 1200000 UNIT/2ML IM SUSP
1.2000 10*6.[IU] | Freq: Once | INTRAMUSCULAR | Status: AC
  Administered 2013-05-04: 1.2 10*6.[IU] via INTRAMUSCULAR
  Filled 2013-05-04: qty 2

## 2013-05-04 NOTE — ED Provider Notes (Signed)
Medical screening examination/treatment/procedure(s) were performed by non-physician practitioner and as supervising physician I was immediately available for consultation/collaboration.   Lyanne Co, MD 05/04/13 1452

## 2013-05-04 NOTE — ED Provider Notes (Signed)
CSN: [id number]     Arrival date & time 05/04/13  1344 History     First MD Initiated Contact with Patient 05/04/13 1401     Chief Complaint  Patient presents with  . Sore Throat   (Consider location/radiation/quality/duration/timing/severity/associated sxs/prior Treatment) HPI Comments: Patient is a 37 year old female who presents with a 3 day history of sore throat. Patient reports gradual onset and progressively worsening sharp, severe throat pain. The pain is constant and made worse with swallowing. The pain is localized to the patient's throat and equal on both sides. Nothing alleviates the pain. The patient has not tried anything for symptom relief. Patient reports associated subjective fever, cervical adenopathy, and non productive cough. Patient denies headache, visual changes, sinus congestion, difficulty breathing, chest pain, SOB, abdominal pain, NVD. Patient also endorses a 3 day history of dysuria and malodorous urine. Patient has not tried anything for symptoms. No aggravating/alleviating factors.      Past Medical History  Diagnosis Date  . TIA (transient ischemic attack)   . Asthma   . Anemia   . GERD (gastroesophageal reflux disease)   . COPD (chronic obstructive pulmonary disease)   . Dyslipidemia   . Goiter   . Chronic rhinitis   . Eczema   . TIA (transient ischemic attack) 11/2010    dx at [facility]  . Hyperlipidemia    Past Surgical History  Procedure Laterality Date  . Cesarean section      x2  . Tubal ligation     Family History  Problem Relation Age of Onset  . Asthma Mother   . Allergies Mother   . Lung cancer Maternal Grandmother    History  Substance Use Topics  . Smoking status: Former Smoker    Types: Cigarettes    Quit date: 09/17/2006  . 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 Alcohol Use: Yes   OB History   Grav Para Term Preterm Abortions TAB SAB Ect Mult Living             Review of Systems  HENT: Positive for sore throat.   Genitourinary: Positive for dysuria.  All other systems reviewed and are negative.    Allergies  Iodine and Iohexol  Home Medications   Current Outpatient Rx  Name  Route  Sig  Dispense  Refill  . albuterol (PROVENTIL) (2.5 MG/3ML) 0.083% nebulizer solution   Nebulization   Take 3 mLs (2.5 mg total) by nebulization every 4 (four) hours as needed for wheezing.   75 mL   0   . albuterol (VENTOLIN HFA) 108 (90 BASE) MCG/ACT inhaler   Inhalation   Inhale 2 puffs into the lungs every 4 (four) hours as needed. For wheezing         . chlorpheniramine-HYDROcodone (TUSSIONEX PENNKINETIC ER) 10-8 MG/5ML LQCR   Oral   Take 5 mLs by mouth every 12 (twelve) hours as needed.   140 mL   0   . clopidogrel (PLAVIX) 75 MG tablet   Oral   Take 75 mg by mouth daily.          BP 116/83  Pulse 83  Temp(Src) 98 F (36.7 C) (Oral)  Resp 16  Ht 5' (1.524 m)  Wt 150 lb (68.04 kg)  BMI 29.3 kg/m2  SpO2 100%  LMP 04/27/2013 Physical Exam  Nursing note and vitals reviewed. Constitutional: She is oriented to person, place, and time.  She appears well-developed and well-nourished. No distress.  HENT:  Head: Normocephalic and atraumatic.  Posterior pharynx erythematous with petechiae noted. No tonsillar edema or exudate.   Eyes: Conjunctivae and EOM are normal. Pupils are equal, round, and reactive to light.  Neck: Normal range of motion.  Cardiovascular: Normal rate and regular rhythm.  Exam reveals no gallop and no friction rub.   No murmur heard. Pulmonary/Chest: Effort normal and breath sounds normal. She has no wheezes. She has no rales. She exhibits no tenderness.  Abdominal: Soft. She exhibits no distension. There is no tenderness. There is no rebound and no guarding.  Musculoskeletal: Normal range of motion.  Lymphadenopathy:    She has cervical adenopathy.  Neurological: She is alert and oriented to person,  place, and time. Coordination normal.  Speech is goal-oriented. Moves limbs without ataxia.   Skin: Skin is warm and dry.  Psychiatric: She has a normal mood and affect. Her behavior is normal.    ED Course   Procedures (including critical care time)  Labs Reviewed  URINALYSIS, ROUTINE W REFLEX MICROSCOPIC - Abnormal; Notable for the following:    APPearance CLOUDY (*)    Leukocytes, UA LARGE (*)    All other components within normal limits  URINE MICROSCOPIC-ADD ON - Abnormal; Notable for the following:    Squamous Epithelial / LPF FEW (*)    Bacteria, UA FEW (*)    All other components within normal limits  RAPID STREP SCREEN  URINE CULTURE  CULTURE, GROUP A STREP  PREGNANCY, URINE   No results found. 1. Strep throat   2. UTI (urinary tract infection)     MDM  2:39 PM Patient will be treated for strep throat due to appearance of petechiae which could indicated bacterial infection. Patient will also be treated for UTI shown on urinalysis. Vitals stable and patient afebrile.   Emilia Beck, PA-C 05/04/13 1447

## 2013-05-04 NOTE — ED Notes (Addendum)
Pt c/o sore throat x 2 days also c/o " tightness in chest when eating" pt reports relief with albuterol inhaler, pt also reports " urine has odor"

## 2013-05-06 LAB — CULTURE, GROUP A STREP

## 2013-05-06 LAB — URINE CULTURE

## 2013-05-07 NOTE — ED Notes (Signed)
+   Urine Culture- treated with appropriate medication per protocol MD. 

## 2013-07-05 ENCOUNTER — Encounter (HOSPITAL_BASED_OUTPATIENT_CLINIC_OR_DEPARTMENT_OTHER): Payer: Self-pay | Admitting: Emergency Medicine

## 2013-07-05 ENCOUNTER — Emergency Department (HOSPITAL_BASED_OUTPATIENT_CLINIC_OR_DEPARTMENT_OTHER)
Admission: EM | Admit: 2013-07-05 | Discharge: 2013-07-05 | Disposition: A | Discharge status: Home / Self Care | Payer: 59 | Attending: Emergency Medicine | Admitting: Emergency Medicine

## 2013-07-05 DIAGNOSIS — Z8673 Personal history of transient ischemic attack (TIA), and cerebral infarction without residual deficits: Secondary | ICD-10-CM | POA: Insufficient documentation

## 2013-07-05 DIAGNOSIS — H538 Other visual disturbances: Secondary | ICD-10-CM | POA: Insufficient documentation

## 2013-07-05 DIAGNOSIS — J449 Chronic obstructive pulmonary disease, unspecified: Secondary | ICD-10-CM | POA: Insufficient documentation

## 2013-07-05 DIAGNOSIS — Z862 Personal history of diseases of the blood and blood-forming organs and certain disorders involving the immune mechanism: Secondary | ICD-10-CM | POA: Insufficient documentation

## 2013-07-05 DIAGNOSIS — Z8719 Personal history of other diseases of the digestive system: Secondary | ICD-10-CM | POA: Insufficient documentation

## 2013-07-05 DIAGNOSIS — R51 Headache: Secondary | ICD-10-CM | POA: Insufficient documentation

## 2013-07-05 DIAGNOSIS — Z872 Personal history of diseases of the skin and subcutaneous tissue: Secondary | ICD-10-CM | POA: Insufficient documentation

## 2013-07-05 DIAGNOSIS — Z87891 Personal history of nicotine dependence: Secondary | ICD-10-CM | POA: Insufficient documentation

## 2013-07-05 DIAGNOSIS — Z79899 Other long term (current) drug therapy: Secondary | ICD-10-CM | POA: Insufficient documentation

## 2013-07-05 DIAGNOSIS — Z8639 Personal history of other endocrine, nutritional and metabolic disease: Secondary | ICD-10-CM | POA: Insufficient documentation

## 2013-07-05 DIAGNOSIS — R519 Headache, unspecified: Secondary | ICD-10-CM

## 2013-07-05 DIAGNOSIS — J4489 Other specified chronic obstructive pulmonary disease: Secondary | ICD-10-CM | POA: Insufficient documentation

## 2013-07-05 DIAGNOSIS — Z7902 Long term (current) use of antithrombotics/antiplatelets: Secondary | ICD-10-CM | POA: Insufficient documentation

## 2013-07-05 LAB — BASIC METABOLIC PANEL
Calcium: 9.4 mg/dL (ref 8.4–10.5)
GFR calc Af Amer: >90 mL/min (ref >90)
GFR calc non Af Amer: >90 mL/min (ref >90)
Glucose, Bld: 88 mg/dL (ref 70–99)
Sodium: 138 mEq/L (ref 135–145)

## 2013-07-05 NOTE — ED Notes (Addendum)
Reports intermittent squeezing sensation to her head, right side of neck, onset one hour ago.  C/o some blurring vision that has resolved.  Reports history of TIA's, the last being 1 year ago.  Denies trouble walking, talking.  Denies headache.

## 2013-07-05 NOTE — ED Provider Notes (Signed)
CSN: [id number]     Arrival date & time 07/05/13  1613 History  This chart was scribed for Doug Sou, MD by Leone Payor, ED Scribe. This patient was seen in room MH10/MH10 and the patient's care was started 4:59 PM.    Chief Complaint  Patient presents with  . Migraine    The history is provided by the patient. No language interpreter was used.    HPI Comments: Debbie Sexton is a 37 y.o. Female with past medical history of TIA who presents to the Emergency Department complaining of intermittent, unchanged bilateral HA that began 1.5-2 hours ago. Pt describes this pain as squeezing and lasts 10 seconds with episodes occurring every 20 minutes. Pt states she was studying when she began to have bilateral blurry vision. Pt states she is still having some blurry vision which is not normal for her. She denies similar symptoms with her past TIA or migraines. She has taken a baby ASA today. She states she takes plavix regularly and ASA as needed for migraines. Pt also reports having 2 days of pain and swelling to the chest which is resolving. She denies nausea, diaphoresis, dysuria. She is an occasional smoker. Pt reports drinking 2 alcoholic beverages every 2-3 weeks. She denies illicit drug use. No difficulty speaking no focal numbness or weakness symptoms are different than TIA she's had in the past when she had numbness in both hands, and couldn't speak.  Past Medical History  Diagnosis Date  . TIA (transient ischemic attack)   . Asthma   . Anemia   . GERD (gastroesophageal reflux disease)   . COPD (chronic obstructive pulmonary disease)   . Dyslipidemia   . Goiter   . Chronic rhinitis   . Eczema   . TIA (transient ischemic attack) 11/2010    dx at [facility]  . Hyperlipidemia    Past Surgical History  Procedure Laterality Date  . Cesarean section      x2  . Tubal ligation     Family History  Problem Relation Age of Onset  . Asthma Mother   . Allergies Mother   . Lung cancer  Maternal Grandmother    History  Substance Use Topics  . Smoking status: Former Smoker    Types: Cigarettes    Quit date: 09/17/2006  . 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 Alcohol Use: Yes   OB History   Grav Para Term Preterm Abortions TAB SAB Ect Mult Living                 Review of Systems  Constitutional: Negative.  Negative for diaphoresis.  Eyes: Positive for visual disturbance.  Respiratory: Negative.   Cardiovascular: Negative.   Gastrointestinal: Negative.  Negative for nausea.  Genitourinary: Negative for dysuria.  Musculoskeletal: Negative.   Skin: Negative.   Neurological: Positive for headaches.  Psychiatric/Behavioral: Negative.   All other systems reviewed and are negative.    Allergies  Iodine and Iohexol  Home Medications   Current Outpatient Rx  Name  Route  Sig  Dispense  Refill  . albuterol (PROVENTIL) (2.5 MG/3ML) 0.083% nebulizer solution   Nebulization   Take 3 mLs (2.5 mg total) by nebulization every 4 (four) hours as needed for wheezing.   75 mL   0   . albuterol (VENTOLIN HFA) 108 (90 BASE) MCG/ACT inhaler   Inhalation   Inhale 2 puffs into the lungs every 4 (  four) hours as needed. For wheezing         . clopidogrel (PLAVIX) 75 MG tablet   Oral   Take 75 mg by mouth daily.          BP 102/70  Pulse 85  Temp(Src) 97.7 F (36.5 C) (Oral)  Resp 18  Ht 5\' 1"  (1.549 m)  Wt 152 lb (68.947 kg)  BMI 28.74 kg/m2  SpO2 100% Physical Exam  Nursing note and vitals reviewed. Constitutional: She is oriented to person, place, and time. She appears well-developed and well-nourished.  HENT:  Head: Normocephalic and atraumatic.  Eyes: Conjunctivae are normal. Pupils are equal, round, and reactive to light.  Fundi benign  Neck: Neck supple. No tracheal deviation present. No thyromegaly present.  Cardiovascular: Normal rate, regular rhythm and normal heart sounds.    No murmur heard. Pulmonary/Chest: Effort normal and breath sounds normal. No respiratory distress. She has no wheezes. She has no rales. She exhibits no tenderness.  Abdominal: Soft. Bowel sounds are normal. She exhibits no distension. There is no tenderness.  Musculoskeletal: Normal range of motion. She exhibits no edema and no tenderness.  Neurological: She is alert and oriented to person, place, and time. She has normal strength and normal reflexes. She displays normal reflexes. No cranial nerve deficit or sensory deficit. She exhibits normal muscle tone. She displays a negative Romberg sign. Coordination and gait normal.  Normal finger to nose. Gait normal. Romberg negative. Pronator drift negative.   Skin: Skin is warm and dry. No rash noted.  Psychiatric: She has a normal mood and affect.    ED Course  Procedures   DIAGNOSTIC STUDIES: Oxygen Saturation is 100% on RA, normal by my interpretation.    COORDINATION OF CARE: 5:25 PM Discussed treatment plan with pt at bedside and pt agreed to plan.   Labs Review Labs Reviewed - No data to display Imaging Review No results found.  EKG Interpretation     Ventricular Rate:  79 PR Interval:  180 QRS Duration: 104 QT Interval:  384 QTC Calculation: 440 R Axis:   80 Text Interpretation:  Normal sinus rhythm with sinus arrhythmia Incomplete right bundle branch block Borderline ECG No significant change since last tracing           6:30 PM patient is asymptomatic. He alert Glasgow Coma Score 15. MDM  No diagnosis found. Patient has nonfocal neurologic exam. Vision is normal he is not she complains of blurred vision. She has multiple somatic complaints including recent "swelling of her chest". I don't feel that imaging of her brain is indicated. She is in agreement. Symptoms are highly atypical for TIA.  Patient is instructed to followup with her primary care physician tomorrow at [facility],. Diagnosis nonspecific headache    I personally performed the services described in this documentation, which was scribed in my presence. The recorded information has been reviewed and considered.     Doug Sou, MD 07/05/13 [phone]

## 2013-07-05 NOTE — ED Notes (Signed)
Patient states that she is not having a head ache, she is having an intermittent squeezing sensation in her head. States initial blurry vision, but that has since resolved. She states she has had TIA's in the past, but they usually accompany a migraine.

## 2013-08-20 ENCOUNTER — Emergency Department (HOSPITAL_BASED_OUTPATIENT_CLINIC_OR_DEPARTMENT_OTHER)
Admission: EM | Admit: 2013-08-20 | Discharge: 2013-08-20 | Disposition: A | Discharge status: Home / Self Care | Payer: 59 | Attending: Emergency Medicine | Admitting: Emergency Medicine

## 2013-08-20 ENCOUNTER — Encounter (HOSPITAL_BASED_OUTPATIENT_CLINIC_OR_DEPARTMENT_OTHER): Payer: Self-pay | Admitting: Emergency Medicine

## 2013-08-20 DIAGNOSIS — Z7902 Long term (current) use of antithrombotics/antiplatelets: Secondary | ICD-10-CM | POA: Insufficient documentation

## 2013-08-20 DIAGNOSIS — J3489 Other specified disorders of nose and nasal sinuses: Secondary | ICD-10-CM | POA: Insufficient documentation

## 2013-08-20 DIAGNOSIS — Z8719 Personal history of other diseases of the digestive system: Secondary | ICD-10-CM | POA: Insufficient documentation

## 2013-08-20 DIAGNOSIS — Z8673 Personal history of transient ischemic attack (TIA), and cerebral infarction without residual deficits: Secondary | ICD-10-CM | POA: Insufficient documentation

## 2013-08-20 DIAGNOSIS — E785 Hyperlipidemia, unspecified: Secondary | ICD-10-CM | POA: Insufficient documentation

## 2013-08-20 DIAGNOSIS — Z872 Personal history of diseases of the skin and subcutaneous tissue: Secondary | ICD-10-CM | POA: Insufficient documentation

## 2013-08-20 DIAGNOSIS — J4489 Other specified chronic obstructive pulmonary disease: Secondary | ICD-10-CM | POA: Insufficient documentation

## 2013-08-20 DIAGNOSIS — Z862 Personal history of diseases of the blood and blood-forming organs and certain disorders involving the immune mechanism: Secondary | ICD-10-CM | POA: Insufficient documentation

## 2013-08-20 DIAGNOSIS — J449 Chronic obstructive pulmonary disease, unspecified: Secondary | ICD-10-CM | POA: Insufficient documentation

## 2013-08-20 DIAGNOSIS — IMO0002 Reserved for concepts with insufficient information to code with codable children: Secondary | ICD-10-CM | POA: Insufficient documentation

## 2013-08-20 DIAGNOSIS — R0981 Nasal congestion: Secondary | ICD-10-CM

## 2013-08-20 DIAGNOSIS — Z87891 Personal history of nicotine dependence: Secondary | ICD-10-CM | POA: Insufficient documentation

## 2013-08-20 DIAGNOSIS — Z79899 Other long term (current) drug therapy: Secondary | ICD-10-CM | POA: Insufficient documentation

## 2013-08-20 NOTE — ED Notes (Signed)
Pt took Aspirin 325mg  for HA at 1230 and ever since she "hasn't felt right."  Sts she is having bloody sputum.  She is concerned that the Aspirin may be reacting with the Citalopram she took at 8:00 pm.

## 2013-08-20 NOTE — ED Provider Notes (Signed)
CSN: [id number]     Arrival date & time 08/20/13  0248 History   First MD Initiated Contact with Patient 08/20/13 0250     Chief Complaint  Patient presents with  . Allergic Reaction   (Consider location/radiation/quality/duration/timing/severity/associated sxs/prior Treatment) Patient is a 37 y.o. female presenting with allergic reaction.  Allergic Reaction  Pt with history of multiple medical problems reports she took an ASA a short time ago for headache. She felt like it got stuck in her throat and then moved down after drinking some water. She states her throat has 'not felt right' since that time. She reports some recent nasal congestion and post-nasal drip. States this evening she noticed what she thought was some blood tinged sputum but it was not from coughing or vomiting. She denies any SOB, fever, abd pain. She consulted the internet and was concerned about upper GI bleed or an interaction with the Celexa she took earlier in the evening.   Past Medical History  Diagnosis Date  . TIA (transient ischemic attack)   . Asthma   . Anemia   . GERD (gastroesophageal reflux disease)   . COPD (chronic obstructive pulmonary disease)   . Dyslipidemia   . Goiter   . Chronic rhinitis   . Eczema   . TIA (transient ischemic attack) 11/2010    dx at [facility]  . Hyperlipidemia    Past Surgical History  Procedure Laterality Date  . Cesarean section      x2  . Tubal ligation     Family History  Problem Relation Age of Onset  . Asthma Mother   . Allergies Mother   . Lung cancer Maternal Grandmother    History  Substance Use Topics  . Smoking status: Former Smoker    Types: Cigarettes    Quit date: 09/17/2006  . 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 Alcohol Use: Yes   OB History   Grav Para Term Preterm Abortions TAB SAB Ect Mult Living                 Review of Systems All other systems reviewed and are  negative except as noted in HPI.   Allergies  Iodine and Iohexol  Home Medications   Current Outpatient Rx  Name  Route  Sig  Dispense  Refill  . beclomethasone (QVAR) 40 MCG/ACT inhaler   Inhalation   Inhale 1 puff into the lungs 2 (two) times daily.         . citalopram (CELEXA) 20 MG tablet   Oral   Take 20 mg by mouth daily.         Marland Kitchen albuterol (PROVENTIL) (2.5 MG/3ML) 0.083% nebulizer solution   Nebulization   Take 3 mLs (2.5 mg total) by nebulization every 4 (four) hours as needed for wheezing.   75 mL   0   . albuterol (VENTOLIN HFA) 108 (90 BASE) MCG/ACT inhaler   Inhalation   Inhale 2 puffs into the lungs every 4 (four) hours as needed. For wheezing         . clopidogrel (PLAVIX) 75 MG tablet   Oral   Take 75 mg by mouth daily.          BP 105/83  Pulse 88  Temp(Src) 97.9 F (36.6 C) (Oral)  Resp 18  Ht 5\' 1"  (1.549 m)  Wt 150 lb (68.04 kg)  BMI 28.36 kg/m2  SpO2 98%  LMP 08/12/2013 Physical Exam  Nursing note and vitals reviewed. Constitutional: She is oriented to person, place, and time. She appears well-developed and well-nourished.  HENT:  Head: Normocephalic and atraumatic.  Nose: Mucosal edema and rhinorrhea present.  Eyes: EOM are normal. Pupils are equal, round, and reactive to light.  Neck: Normal range of motion. Neck supple.  Cardiovascular: Normal rate, normal heart sounds and intact distal pulses.   Pulmonary/Chest: Effort normal and breath sounds normal. No stridor.  Abdominal: Bowel sounds are normal. She exhibits no distension. There is no tenderness.  Musculoskeletal: Normal range of motion. She exhibits no edema and no tenderness.  Neurological: She is alert and oriented to person, place, and time. She has normal strength. No cranial nerve deficit or sensory deficit.  Skin: Skin is warm and dry. No rash noted.  Psychiatric: She has a normal mood and affect.    ED Course  Procedures (including critical care time) Labs  Review Labs Reviewed - No data to display Imaging Review No results found.  EKG Interpretation   None       MDM   1. Nasal congestion     Essentially normal exam except for moderate nasal congestion. No concern for true hemoptysis or GI bleed. No signs of angioedema or other allergic reaction. Pt reassured, Advised OTC decongestants. PCP followup.    Charles B. Bernette Mayers, MD 08/20/13 4540

## 2013-08-20 NOTE — ED Notes (Signed)
Pt reports abnormal feeling in throat after taking an aspirin and feeling like it got stuck. Also was bringing out blood-tinged sputum "without coughing" - dx with sinus infection earlier today by pcp.

## 2013-09-07 ENCOUNTER — Emergency Department (HOSPITAL_BASED_OUTPATIENT_CLINIC_OR_DEPARTMENT_OTHER)
Admission: EM | Admit: 2013-09-07 | Discharge: 2013-09-07 | Disposition: A | Discharge status: Home / Self Care | Payer: 59 | Attending: Emergency Medicine | Admitting: Emergency Medicine

## 2013-09-07 ENCOUNTER — Encounter (HOSPITAL_BASED_OUTPATIENT_CLINIC_OR_DEPARTMENT_OTHER): Payer: Self-pay | Admitting: Emergency Medicine

## 2013-09-07 DIAGNOSIS — R599 Enlarged lymph nodes, unspecified: Secondary | ICD-10-CM | POA: Insufficient documentation

## 2013-09-07 DIAGNOSIS — R591 Generalized enlarged lymph nodes: Secondary | ICD-10-CM

## 2013-09-07 DIAGNOSIS — R062 Wheezing: Secondary | ICD-10-CM | POA: Insufficient documentation

## 2013-09-07 DIAGNOSIS — Z87891 Personal history of nicotine dependence: Secondary | ICD-10-CM | POA: Insufficient documentation

## 2013-09-07 DIAGNOSIS — K149 Disease of tongue, unspecified: Secondary | ICD-10-CM | POA: Insufficient documentation

## 2013-09-07 DIAGNOSIS — Z8639 Personal history of other endocrine, nutritional and metabolic disease: Secondary | ICD-10-CM | POA: Insufficient documentation

## 2013-09-07 DIAGNOSIS — IMO0002 Reserved for concepts with insufficient information to code with codable children: Secondary | ICD-10-CM | POA: Insufficient documentation

## 2013-09-07 DIAGNOSIS — Z8673 Personal history of transient ischemic attack (TIA), and cerebral infarction without residual deficits: Secondary | ICD-10-CM | POA: Insufficient documentation

## 2013-09-07 DIAGNOSIS — Z7902 Long term (current) use of antithrombotics/antiplatelets: Secondary | ICD-10-CM | POA: Insufficient documentation

## 2013-09-07 DIAGNOSIS — R0602 Shortness of breath: Secondary | ICD-10-CM | POA: Insufficient documentation

## 2013-09-07 DIAGNOSIS — J449 Chronic obstructive pulmonary disease, unspecified: Secondary | ICD-10-CM | POA: Insufficient documentation

## 2013-09-07 DIAGNOSIS — Z872 Personal history of diseases of the skin and subcutaneous tissue: Secondary | ICD-10-CM | POA: Insufficient documentation

## 2013-09-07 DIAGNOSIS — R059 Cough, unspecified: Secondary | ICD-10-CM | POA: Insufficient documentation

## 2013-09-07 DIAGNOSIS — Z862 Personal history of diseases of the blood and blood-forming organs and certain disorders involving the immune mechanism: Secondary | ICD-10-CM | POA: Insufficient documentation

## 2013-09-07 DIAGNOSIS — K148 Other diseases of tongue: Secondary | ICD-10-CM

## 2013-09-07 DIAGNOSIS — J4489 Other specified chronic obstructive pulmonary disease: Secondary | ICD-10-CM | POA: Insufficient documentation

## 2013-09-07 DIAGNOSIS — Z79899 Other long term (current) drug therapy: Secondary | ICD-10-CM | POA: Insufficient documentation

## 2013-09-07 DIAGNOSIS — R05 Cough: Secondary | ICD-10-CM | POA: Insufficient documentation

## 2013-09-07 NOTE — ED Provider Notes (Signed)
CSN: [id number]     Arrival date & time 09/07/13  0825 History   First MD Initiated Contact with Patient 09/07/13 0845     Chief Complaint  Patient presents with  . Cough  . Nasal Congestion   (Consider location/radiation/quality/duration/timing/severity/associated sxs/prior Treatment) Patient is a 37 y.o. female presenting with URI.  URI Presenting symptoms: cough   Presenting symptoms: no congestion and no fever   Presenting symptoms comment:  Sore tongue, lymphadenopathy Severity:  Moderate Onset quality:  Gradual Duration:  1 week Timing:  Constant Progression:  Worsening Chronicity:  New Relieved by: ibuprofen. Worsened by:  Nothing tried Associated symptoms: wheezing   Associated symptoms comment:  No fevers, no congestion, no sore throat   Past Medical History  Diagnosis Date  . TIA (transient ischemic attack)   . Asthma   . Anemia   . GERD (gastroesophageal reflux disease)   . COPD (chronic obstructive pulmonary disease)   . Dyslipidemia   . Goiter   . Chronic rhinitis   . Eczema   . TIA (transient ischemic attack) 11/2010    dx at [facility]  . Hyperlipidemia    Past Surgical History  Procedure Laterality Date  . Cesarean section      x2  . Tubal ligation     Family History  Problem Relation Age of Onset  . Asthma Mother   . Allergies Mother   . Lung cancer Maternal Grandmother    History  Substance Use Topics  . Smoking status: Former Smoker    Types: Cigarettes    Quit date: 09/17/2006  . 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 Alcohol Use: Yes   OB History   Grav Para Term Preterm Abortions TAB SAB Ect Mult Living                 Review of Systems  Constitutional: Negative for fever.  HENT: Negative for congestion.   Respiratory: Positive for cough, shortness of breath and wheezing.   Cardiovascular: Negative for chest pain.  Gastrointestinal: Negative for nausea,  vomiting, abdominal pain and diarrhea.  All other systems reviewed and are negative.    Allergies  Iodine and Iohexol  Home Medications   Current Outpatient Rx  Name  Route  Sig  Dispense  Refill  . albuterol (PROVENTIL) (2.5 MG/3ML) 0.083% nebulizer solution   Nebulization   Take 3 mLs (2.5 mg total) by nebulization every 4 (four) hours as needed for wheezing.   75 mL   0   . albuterol (VENTOLIN HFA) 108 (90 BASE) MCG/ACT inhaler   Inhalation   Inhale 2 puffs into the lungs every 4 (four) hours as needed. For wheezing         . beclomethasone (QVAR) 40 MCG/ACT inhaler   Inhalation   Inhale 1 puff into the lungs 2 (two) times daily.         . citalopram (CELEXA) 20 MG tablet   Oral   Take 20 mg by mouth daily.         . clopidogrel (PLAVIX) 75 MG tablet   Oral   Take 75 mg by mouth daily.          BP 100/61  Pulse 90  Temp(Src) 98.2 F (36.8 C) (Oral)  Resp 18  SpO2 99%  LMP 08/12/2013 Physical Exam  Nursing note and vitals reviewed. Constitutional: She is oriented to person, place, and time. She appears  well-developed and well-nourished. No distress.  HENT:  Head: Normocephalic and atraumatic.  Mouth/Throat: Oropharynx is clear and moist.    Eyes: Conjunctivae are normal. Pupils are equal, round, and reactive to light. No scleral icterus.  Neck: Neck supple.  Cardiovascular: Normal rate, regular rhythm, normal heart sounds and intact distal pulses.   No murmur heard. Pulmonary/Chest: Effort normal and breath sounds normal. No stridor. No respiratory distress. She has no wheezes. She has no rales.  Abdominal: Soft. Bowel sounds are normal. She exhibits no distension. There is no tenderness.  Musculoskeletal: Normal range of motion.  Lymphadenopathy:       Head (right side): Submental (small, firm, tender) and submandibular adenopathy present.       Head (left side): Submandibular adenopathy present.  Right submental lymph node is most prominent    Neurological: She is alert and oriented to person, place, and time.  Skin: Skin is warm and dry. No rash noted.  Psychiatric: She has a normal mood and affect. Her behavior is normal.    ED Course  Procedures (including critical care time) Labs Review Labs Reviewed - No data to display Imaging Review No results found.  EKG Interpretation   None       MDM   1. Lesion of tongue   2. Lymphadenopathy    Pt recovered from a URI several weeks ago when prescribed prednisone.  However, over the past week she has felt the need to use her albuterol more.  She has used her qvar up to 3 times a day, also.  She presents today due to a sore on her tongue and swollen lymph nodes.  The sore is atypical in appearance, but may be related to her overuse of qvar (which she was advised against).  It does not appear super-infected.  No airway symptoms or compromise.  Advised pcp follow up to evaluate for resolution of tongue lesion.    Also, no evidence of asthma exacerbation.  No wheezing or increased WOB.  O2 sats 100% on RA.      Candyce Churn, MD 09/07/13 [phone]

## 2013-09-07 NOTE — ED Notes (Signed)
Pt amb to triage with quick steady gait in nad. Pt reports cough and congestion, dx with sinus infection at her pcp 2 weeks ago, sx resolved, 2 days ago noticed cough and congestion again.

## 2014-06-04 ENCOUNTER — Encounter: Payer: Self-pay | Admitting: Gastroenterology

## 2014-08-03 ENCOUNTER — Emergency Department (HOSPITAL_BASED_OUTPATIENT_CLINIC_OR_DEPARTMENT_OTHER): Payer: Medicaid Other

## 2014-08-03 ENCOUNTER — Encounter (HOSPITAL_BASED_OUTPATIENT_CLINIC_OR_DEPARTMENT_OTHER): Payer: Self-pay

## 2014-08-03 ENCOUNTER — Emergency Department (HOSPITAL_BASED_OUTPATIENT_CLINIC_OR_DEPARTMENT_OTHER)
Admission: EM | Admit: 2014-08-03 | Discharge: 2014-08-03 | Disposition: A | Discharge status: Home / Self Care | Payer: Medicaid Other | Attending: Emergency Medicine | Admitting: Emergency Medicine

## 2014-08-03 DIAGNOSIS — Z7952 Long term (current) use of systemic steroids: Secondary | ICD-10-CM | POA: Insufficient documentation

## 2014-08-03 DIAGNOSIS — R0602 Shortness of breath: Secondary | ICD-10-CM

## 2014-08-03 DIAGNOSIS — R600 Localized edema: Secondary | ICD-10-CM | POA: Insufficient documentation

## 2014-08-03 DIAGNOSIS — J4521 Mild intermittent asthma with (acute) exacerbation: Secondary | ICD-10-CM | POA: Diagnosis not present

## 2014-08-03 DIAGNOSIS — Z87891 Personal history of nicotine dependence: Secondary | ICD-10-CM | POA: Insufficient documentation

## 2014-08-03 DIAGNOSIS — Z79899 Other long term (current) drug therapy: Secondary | ICD-10-CM | POA: Diagnosis not present

## 2014-08-03 DIAGNOSIS — M25441 Effusion, right hand: Secondary | ICD-10-CM

## 2014-08-03 DIAGNOSIS — Z8673 Personal history of transient ischemic attack (TIA), and cerebral infarction without residual deficits: Secondary | ICD-10-CM | POA: Diagnosis not present

## 2014-08-03 DIAGNOSIS — Z8639 Personal history of other endocrine, nutritional and metabolic disease: Secondary | ICD-10-CM | POA: Diagnosis not present

## 2014-08-03 DIAGNOSIS — J4520 Mild intermittent asthma, uncomplicated: Secondary | ICD-10-CM

## 2014-08-03 DIAGNOSIS — Z7902 Long term (current) use of antithrombotics/antiplatelets: Secondary | ICD-10-CM | POA: Diagnosis not present

## 2014-08-03 MED ORDER — ALBUTEROL SULFATE (2.5 MG/3ML) 0.083% IN NEBU
INHALATION_SOLUTION | RESPIRATORY_TRACT | Status: AC
  Administered 2014-08-03: 2.5 mg
  Filled 2014-08-03: qty 3

## 2014-08-03 MED ORDER — PREDNISONE 50 MG PO TABS
60.0000 mg | ORAL_TABLET | Freq: Once | ORAL | Status: AC
  Administered 2014-08-03: 60 mg via ORAL
  Filled 2014-08-03 (×2): qty 1

## 2014-08-03 MED ORDER — PREDNISONE 10 MG PO TABS: ORAL_TABLET | ORAL | Status: DC

## 2014-08-03 MED ORDER — IPRATROPIUM-ALBUTEROL 0.5-2.5 (3) MG/3ML IN SOLN
RESPIRATORY_TRACT | Status: AC
  Administered 2014-08-03: 3 mL
  Filled 2014-08-03: qty 3

## 2014-08-03 NOTE — Discharge Instructions (Signed)
Continue to use your inhalers as needed. If you are having to use them more than directed follow up with your doctor or return here.  Asthma Asthma is a condition of the lungs in which the airways tighten and narrow. Asthma can make it hard to breathe. Asthma cannot be cured, but medicine and lifestyle changes can help control it. Asthma may be started (triggered) by:  Animal skin flakes (dander).  Dust.  Cockroaches.  Pollen.  Mold.  Smoke.  Cleaning products.  Hair sprays or aerosol sprays.  Paint fumes or strong smells.  Cold air, weather changes, and winds.  Crying or laughing hard.  Stress.  Certain medicines or drugs.  Foods, such as dried fruit, potato chips, and sparkling grape juice.  Infections or conditions (colds, flu).  Exercise.  Certain medical conditions or diseases.  Exercise or tiring activities. HOME CARE   Take medicine as told by your doctor.  Use a peak flow meter as told by your doctor. A peak flow meter is a tool that measures how well the lungs are working.  Record and keep track of the peak flow meter's readings.  Understand and use the asthma action plan. An asthma action plan is a written plan for taking care of your asthma and treating your attacks.  To help prevent asthma attacks:  Do not smoke. Stay away from secondhand smoke.  Change your heating and air conditioning filter often.  Limit your use of fireplaces and wood stoves.  Get rid of pests (such as roaches and mice) and their droppings.  Throw away plants if you see mold on them.  Clean your floors. Dust regularly. Use cleaning products that do not smell.  Have someone vacuum when you are not home. Use a vacuum cleaner with a HEPA filter if possible.  Replace carpet with wood, tile, or vinyl flooring. Carpet can trap animal skin flakes and dust.  Use allergy-proof pillows, mattress covers, and box spring covers.  Wash bed sheets and blankets every week in hot  water and dry them in a dryer.  Use blankets that are made of polyester or cotton.  Clean bathrooms and kitchens with bleach. If possible, have someone repaint the walls in these rooms with mold-resistant paint. Keep out of the rooms that are being cleaned and painted.  Wash hands often. GET HELP IF:  You have make a whistling sound when breaking (wheeze), have shortness of breath, or have a cough even if taking medicine to prevent attacks.  The colored mucus you cough up (sputum) is thicker than usual.  The colored mucus you cough up changes from clear or white to yellow, green, gray, or bloody.  You have problems from the medicine you are taking such as:  A rash.  Itching.  Swelling.  Trouble breathing.  You need reliever medicines more than 2-3 times a week.  Your peak flow measurement is still at 50-79% of your personal best after following the action plan for 1 hour.  You have a fever. GET HELP RIGHT AWAY IF:   You seem to be worse and are not responding to medicine during an asthma attack.  You are short of breath even at rest.  You get short of breath when doing very little activity.  You have trouble eating, drinking, or talking.  You have chest pain.  You have a fast heartbeat.  Your lips or fingernails start to turn blue.  You are light-headed, dizzy, or faint.  Your peak flow is less than  50% of your personal best. MAKE SURE YOU:   Understand these instructions.  Will watch your condition.  Will get help right away if you are not doing well or get worse. Document Released: 02/20/2008 Document Revised: 01/18/2014 Document Reviewed: 04/02/2013 [facility] Patient Information 2015 [place], [place]. This information is not intended to replace advice given to you by your health care provider. Make sure you discuss any questions you have with your health care provider.

## 2014-08-03 NOTE — ED Notes (Signed)
SOB x 3 weeks, taking inhaler, however SOB is worse.  Using inhaler every hour x 2 weeks.  Pain under left breast that worsens with laughing on inhaling.

## 2014-08-03 NOTE — ED Provider Notes (Signed)
CSN: [id number]     Arrival date & time 08/03/14  1130 History   First MD Initiated Contact with Patient 08/03/14 1212     Chief Complaint  Patient presents with  . Shortness of Breath     (Consider location/radiation/quality/duration/timing/severity/associated sxs/prior Treatment) Patient is a 38 y.o. female presenting with shortness of breath. The history is provided by the patient.  Shortness of Breath Severity:  Moderate Onset quality:  Gradual Duration:  3 weeks Timing:  Constant Progression:  Worsening Chronicity:  New Relieved by:  Nothing Worsened by:  Nothing tried Ineffective treatments:  Inhaler and rest Associated symptoms: chest pain and wheezing   Associated symptoms: no fever and no vomiting  Headaches: occasionally. Rash: eczema.    Debbie Sexton is a 38 y.o. female who presents to the ED with shortness of breath that started 3 weeks ago. She reports using her inhaler every hour for the past 2 weeks.   Patient given Duoneb on arrival to the ED and states she is feeling a lot better after the treatment.   Past Medical History  Diagnosis Date  . TIA (transient ischemic attack)   . Asthma   . Anemia   . GERD (gastroesophageal reflux disease)   . COPD (chronic obstructive pulmonary disease)   . Dyslipidemia   . Goiter   . Chronic rhinitis   . Eczema   . TIA (transient ischemic attack) 11/2010    [facility]  . Hyperlipidemia    Past Surgical History  Procedure Laterality Date  . Cesarean section      x2  . Tubal ligation     Family History  Problem Relation Age of Onset  . Asthma Mother   . Allergies Mother   . Lung cancer Maternal Grandmother    History  Substance Use Topics  . Smoking status: Former Smoker    Types: Cigarettes    Quit date: 09/17/2006  . 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 Alcohol Use: Yes     Comment: occasional   OB History    No data available       Review of Systems  Constitutional: Negative for fever and chills.  HENT: Negative.   Eyes: Negative for pain, redness, itching and visual disturbance.  Respiratory: Positive for chest tightness, shortness of breath and wheezing.        Left rib pain that is worse with cough.  Cardiovascular: Positive for chest pain. Negative for palpitations and leg swelling.  Gastrointestinal: Negative for nausea, vomiting and diarrhea.  Genitourinary: Positive for decreased urine volume. Negative for dysuria, urgency, frequency, flank pain, vaginal bleeding and vaginal discharge.  Musculoskeletal: Negative for myalgias and back pain.       Right thumb pain  Skin: Rash: eczema.  Neurological: Negative for syncope and light-headedness. Headaches: occasionally.  Psychiatric/Behavioral: Negative for confusion. The patient is not nervous/anxious (taking medication).       Allergies  Iodine and Iohexol  Home Medications   Prior to Admission medications   Medication Sig Start Date End Date Taking? Authorizing Provider  albuterol (PROVENTIL) (2.5 MG/3ML) 0.083% nebulizer solution Take 3 mLs (2.5 mg total) by nebulization every 4 (four) hours as needed for wheezing. 12/04/12   Gerhard Munchobert Lockwood, MD  albuterol (VENTOLIN HFA) 108 (90 BASE) MCG/ACT inhaler Inhale 2 puffs into the lungs every 4 (four) hours as needed. For wheezing    Historical Provider, MD  beclomethasone (  QVAR) 40 MCG/ACT inhaler Inhale 1 puff into the lungs 2 (two) times daily.    Historical Provider, MD  citalopram (CELEXA) 20 MG tablet Take 20 mg by mouth daily.    Historical Provider, MD  clopidogrel (PLAVIX) 75 MG tablet Take 75 mg by mouth daily.    Historical Provider, MD   BP 115/70 mmHg  Pulse 64  Temp(Src) 98.3 F (36.8 C) (Oral)  Resp 18  Ht 5\' 1"  (1.549 m)  Wt 155 lb (70.308 kg)  BMI 29.30 kg/m2  SpO2 100%  LMP 07/31/2014 Physical Exam  Constitutional: She is oriented to person, place, and time. She appears  well-developed and well-nourished.  HENT:  Head: Normocephalic and atraumatic.  Right Ear: Tympanic membrane normal.  Left Ear: Tympanic membrane normal.  Nose: Nose normal.  Mouth/Throat: Uvula is midline and mucous membranes are normal. Posterior oropharyngeal erythema present.  Eyes: Conjunctivae and EOM are normal. Pupils are equal, round, and reactive to light.  Neck: Normal range of motion. Neck supple.  Cardiovascular: Normal rate.   Pulmonary/Chest: Effort normal. She has decreased breath sounds. Wheezes: occasional.  Abdominal: Soft. Bowel sounds are normal. There is no tenderness.  Musculoskeletal:       Hands: Right thumb with swelling and pain with range of motion.  Neurological: She is alert and oriented to person, place, and time. No cranial nerve deficit.  Skin: Skin is warm and dry.  Psychiatric: She has a normal mood and affect. Her behavior is normal.  Nursing note and vitals reviewed.   ED Course  Procedures  EKG Interpretation  Date/Time:  Tuesday August 03 2014 11:34:51 EST Ventricular Rate:  75 PR Interval:  176 QRS Duration: 112 QT Interval:  380 QTC Calculation: 424 R Axis:   83 Text Interpretation:  Normal sinus rhythm with sinus arrhythmia Normal ECG No significant change since last tracing Confirmed by ZACKOWSKI  MD, SCOTT [phone]) on 08/03/2014 12:14:41 PM  Dg Chest 2 View  08/03/2014   CLINICAL DATA:  38 year old female with acute shortness of breath and chest pain. Initial encounter.  EXAM: CHEST  2 VIEW  COMPARISON:  11/2012.  FINDINGS: Normal cardiac size and mediastinal contours. Visualized tracheal air column is within normal limits. Lung volumes are stable and within normal limits. No pneumothorax, pulmonary edema, pleural effusion or confluent pulmonary opacity. No acute osseous abnormality identified.  IMPRESSION: Negative, no acute cardiopulmonary abnormality.   Electronically Signed   By: Augusto Gamble M.D.   On: 08/03/2014 13:15   Dg Finger  Thumb Right  08/03/2014   CLINICAL DATA:  Pain and swelling for 3 days.  No known injury.  EXAM: RIGHT THUMB 2+V  COMPARISON:  None.  FINDINGS: The joint spaces are maintained.  No acute bony findings.  IMPRESSION: No acute bony findings or degenerative changes.   Electronically Signed   By: Loralie Champagne M.D.   On: 08/03/2014 13:18    After breathing treatment of Albuterol/Atrovent the patient states she feels better than she has in 3 weeks. Speaking in full sentences and O2 SAT 100 % on R/A.  MDM  38 y.o. female with right thumb pain and cough and wheezing x 3 weeks despite use of inhalers. Will start prednisone and she will follow up with her PCP for her asthma and her thumb pain. I have reviewed this patient's vital signs, nurses notes, appropriate labs and imaging.  I have discussed findings and plan of care with the patient and she voices understanding and agrees with plan.  Medication List    TAKE these medications        predniSONE 10 MG tablet  Commonly known as:  DELTASONE  Starting tomorrow take 5 tablets PO then 4, 3, 2, 1      ASK your doctor about these medications        beclomethasone 40 MCG/ACT inhaler  Commonly known as:  QVAR  Inhale 1 puff into the lungs 2 (two) times daily.     citalopram 20 MG tablet  Commonly known as:  CELEXA  Take 20 mg by mouth daily.     clopidogrel 75 MG tablet  Commonly known as:  PLAVIX  Take 75 mg by mouth daily.     VENTOLIN HFA 108 (90 BASE) MCG/ACT inhaler  Generic drug:  albuterol  Inhale 2 puffs into the lungs every 4 (four) hours as needed. For wheezing     albuterol (2.5 MG/3ML) 0.083% nebulizer solution  Commonly known as:  PROVENTIL  Take 3 mLs (2.5 mg total) by nebulization every 4 (four) hours as needed for wheezing.            RoletteHope M Neese, NP 08/03/14 1340  Vanetta MuldersScott Zackowski, MD 08/03/14 [phone]

## 2014-11-04 ENCOUNTER — Emergency Department (HOSPITAL_BASED_OUTPATIENT_CLINIC_OR_DEPARTMENT_OTHER)
Admission: EM | Admit: 2014-11-04 | Discharge: 2014-11-04 | Disposition: A | Discharge status: Home / Self Care | Payer: Medicaid Other | Attending: Emergency Medicine | Admitting: Emergency Medicine

## 2014-11-04 ENCOUNTER — Encounter (HOSPITAL_BASED_OUTPATIENT_CLINIC_OR_DEPARTMENT_OTHER): Payer: Self-pay

## 2014-11-04 DIAGNOSIS — Z7902 Long term (current) use of antithrombotics/antiplatelets: Secondary | ICD-10-CM | POA: Diagnosis not present

## 2014-11-04 DIAGNOSIS — Z8639 Personal history of other endocrine, nutritional and metabolic disease: Secondary | ICD-10-CM | POA: Insufficient documentation

## 2014-11-04 DIAGNOSIS — Z79899 Other long term (current) drug therapy: Secondary | ICD-10-CM | POA: Insufficient documentation

## 2014-11-04 DIAGNOSIS — Z87891 Personal history of nicotine dependence: Secondary | ICD-10-CM | POA: Insufficient documentation

## 2014-11-04 DIAGNOSIS — Z862 Personal history of diseases of the blood and blood-forming organs and certain disorders involving the immune mechanism: Secondary | ICD-10-CM | POA: Diagnosis not present

## 2014-11-04 DIAGNOSIS — Z7951 Long term (current) use of inhaled steroids: Secondary | ICD-10-CM | POA: Diagnosis not present

## 2014-11-04 DIAGNOSIS — J449 Chronic obstructive pulmonary disease, unspecified: Secondary | ICD-10-CM | POA: Diagnosis not present

## 2014-11-04 DIAGNOSIS — Z8719 Personal history of other diseases of the digestive system: Secondary | ICD-10-CM | POA: Insufficient documentation

## 2014-11-04 DIAGNOSIS — Z8673 Personal history of transient ischemic attack (TIA), and cerebral infarction without residual deficits: Secondary | ICD-10-CM | POA: Diagnosis not present

## 2014-11-04 DIAGNOSIS — L509 Urticaria, unspecified: Secondary | ICD-10-CM

## 2014-11-04 DIAGNOSIS — R21 Rash and other nonspecific skin eruption: Secondary | ICD-10-CM | POA: Diagnosis present

## 2014-11-04 MED ORDER — PREDNISONE 10 MG PO TABS: 20.0000 mg | ORAL_TABLET | Freq: Every day | ORAL | Status: DC

## 2014-11-04 MED ORDER — FEXOFENADINE HCL 60 MG PO TABS: 60.0000 mg | ORAL_TABLET | Freq: Two times a day (BID) | ORAL | Status: DC

## 2014-11-04 MED ORDER — PREDNISONE 20 MG PO TABS
40.0000 mg | ORAL_TABLET | Freq: Once | ORAL | Status: AC
  Administered 2014-11-04: 40 mg via ORAL
  Filled 2014-11-04: qty 2

## 2014-11-04 NOTE — ED Notes (Signed)
Pt reports diffuse rash x 1 week. Sts every time it is in a different place. Rash to face and hands today. No recent changes.

## 2014-11-04 NOTE — ED Provider Notes (Signed)
CSN: [id number]     Arrival date & time 11/04/14  1732 History   First MD Initiated Contact with Patient 11/04/14 1814     Chief Complaint  Patient presents with  . Rash     (Consider location/radiation/quality/duration/timing/severity/associated sxs/prior Treatment) Patient is a 39 y.o. female presenting with rash.  Rash  Debbie Sexton is a 39 y.o. female who presents to the ED with a rash that started one week ago. She complains of itching. She states that the rash comes and goes. Today it is on her forehead and hands. She does not remember any recent changes in anything.   Past Medical History  Diagnosis Date  . TIA (transient ischemic attack)   . Asthma   . Anemia   . GERD (gastroesophageal reflux disease)   . COPD (chronic obstructive pulmonary disease)   . Dyslipidemia   . Goiter   . Chronic rhinitis   . Eczema   . TIA (transient ischemic attack) 11/2010    dx at [facility]  . Hyperlipidemia    Past Surgical History  Procedure Laterality Date  . Cesarean section      x2  . Tubal ligation     Family History  Problem Relation Age of Onset  . Asthma Mother   . Allergies Mother   . Lung cancer Maternal Grandmother    History  Substance Use Topics  . Smoking status: Former Smoker    Types: Cigarettes    Quit date: 09/17/2006  . 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 Alcohol Use: Yes     Comment: occasional   OB History    No data available     Review of Systems  Skin: Positive for rash.  all other systems negative    Allergies  Iodine and Iohexol  Home Medications   Prior to Admission medications   Medication Sig Start Date End Date Taking? Authorizing Provider  albuterol (PROVENTIL) (2.5 MG/3ML) 0.083% nebulizer solution Take 3 mLs (2.5 mg total) by nebulization every 4 (four) hours as needed for wheezing. 12/04/12   Gerhard Munch, MD  albuterol (VENTOLIN HFA) 108 (90 BASE)  MCG/ACT inhaler Inhale 2 puffs into the lungs every 4 (four) hours as needed. For wheezing    Historical Provider, MD  beclomethasone (QVAR) 40 MCG/ACT inhaler Inhale 1 puff into the lungs 2 (two) times daily.    Historical Provider, MD  citalopram (CELEXA) 20 MG tablet Take 20 mg by mouth daily.    Historical Provider, MD  clopidogrel (PLAVIX) 75 MG tablet Take 75 mg by mouth daily.    Historical Provider, MD  fexofenadine (ALLEGRA) 60 MG tablet Take 1 tablet (60 mg total) by mouth 2 (two) times daily. 11/04/14   Zamzam Whinery Orlene Och, NP  predniSONE (DELTASONE) 10 MG tablet Take 2 tablets (20 mg total) by mouth daily. 11/04/14   Donaldo Teegarden Orlene Och, NP   BP 111/73 mmHg  Pulse 94  Temp(Src) 98.7 F (37.1 C) (Oral)  Resp 16  Ht 5' (1.524 m)  Wt 150 lb (68.04 kg)  BMI 29.30 kg/m2  SpO2 100%  LMP 10/28/2014 Physical Exam  Constitutional: She is oriented to person, place, and time. She appears well-developed and well-nourished. No distress.  Eyes: Conjunctivae and EOM are normal.  Neck: Normal range of motion. Neck supple.  Cardiovascular: Normal rate and regular rhythm.   Pulmonary/Chest: Effort normal. She has no wheezes.  Abdominal: Soft. There is  no tenderness.  Musculoskeletal: Normal range of motion.  Neurological: She is alert and oriented to person, place, and time. No cranial nerve deficit.  Skin: Skin is warm and dry.  Raised red, hive like areas to forehead and wrists.   Psychiatric: She has a normal mood and affect. Her behavior is normal.  Nursing note and vitals reviewed.   ED Course  Procedures   MDM  39 y.o. female with rash and itching that comes and goes. Will treat for hives. Discussed with the patient possible allergic reaction vs viral rash. She will follow up with her PCP. Stable for discharge without difficulty swallowing or swelling of lips or throat, no respiratory symptoms. O2 SAT 100% on R/A.  Final diagnoses:  [address]      Dianelly Ferran Orlene OchM Sujay Grundman, NP 11/08/14 1536  Glynn OctaveStephen  Rancour, MD 11/08/14 [phone]

## 2015-04-19 ENCOUNTER — Emergency Department (HOSPITAL_BASED_OUTPATIENT_CLINIC_OR_DEPARTMENT_OTHER)
Admission: EM | Admit: 2015-04-19 | Discharge: 2015-04-19 | Disposition: A | Discharge status: Home / Self Care | Payer: Medicaid Other | Attending: Emergency Medicine | Admitting: Emergency Medicine

## 2015-04-19 ENCOUNTER — Encounter (HOSPITAL_BASED_OUTPATIENT_CLINIC_OR_DEPARTMENT_OTHER): Payer: Self-pay | Admitting: Emergency Medicine

## 2015-04-19 DIAGNOSIS — Z862 Personal history of diseases of the blood and blood-forming organs and certain disorders involving the immune mechanism: Secondary | ICD-10-CM | POA: Insufficient documentation

## 2015-04-19 DIAGNOSIS — Z8673 Personal history of transient ischemic attack (TIA), and cerebral infarction without residual deficits: Secondary | ICD-10-CM | POA: Diagnosis not present

## 2015-04-19 DIAGNOSIS — Z8719 Personal history of other diseases of the digestive system: Secondary | ICD-10-CM | POA: Diagnosis not present

## 2015-04-19 DIAGNOSIS — Z87891 Personal history of nicotine dependence: Secondary | ICD-10-CM | POA: Insufficient documentation

## 2015-04-19 DIAGNOSIS — J449 Chronic obstructive pulmonary disease, unspecified: Secondary | ICD-10-CM | POA: Insufficient documentation

## 2015-04-19 DIAGNOSIS — Z79899 Other long term (current) drug therapy: Secondary | ICD-10-CM | POA: Diagnosis not present

## 2015-04-19 DIAGNOSIS — Z8639 Personal history of other endocrine, nutritional and metabolic disease: Secondary | ICD-10-CM | POA: Insufficient documentation

## 2015-04-19 DIAGNOSIS — Z7951 Long term (current) use of inhaled steroids: Secondary | ICD-10-CM | POA: Diagnosis not present

## 2015-04-19 DIAGNOSIS — Z7952 Long term (current) use of systemic steroids: Secondary | ICD-10-CM | POA: Insufficient documentation

## 2015-04-19 DIAGNOSIS — R51 Headache: Secondary | ICD-10-CM | POA: Diagnosis present

## 2015-04-19 DIAGNOSIS — G44219 Episodic tension-type headache, not intractable: Secondary | ICD-10-CM | POA: Diagnosis not present

## 2015-04-19 DIAGNOSIS — Z872 Personal history of diseases of the skin and subcutaneous tissue: Secondary | ICD-10-CM | POA: Diagnosis not present

## 2015-04-19 DIAGNOSIS — Z7902 Long term (current) use of antithrombotics/antiplatelets: Secondary | ICD-10-CM | POA: Diagnosis not present

## 2015-04-19 DIAGNOSIS — J45909 Unspecified asthma, uncomplicated: Secondary | ICD-10-CM | POA: Insufficient documentation

## 2015-04-19 DIAGNOSIS — H43399 Other vitreous opacities, unspecified eye: Secondary | ICD-10-CM

## 2015-04-19 DIAGNOSIS — H43393 Other vitreous opacities, bilateral: Secondary | ICD-10-CM | POA: Insufficient documentation

## 2015-04-19 MED ORDER — ACETAMINOPHEN 500 MG PO TABS
1000.0000 mg | ORAL_TABLET | Freq: Once | ORAL | Status: AC
  Administered 2015-04-19: 1000 mg via ORAL
  Filled 2015-04-19: qty 2

## 2015-04-19 MED ORDER — METOCLOPRAMIDE HCL 5 MG/ML IJ SOLN
10.0000 mg | Freq: Once | INTRAMUSCULAR | Status: AC
  Administered 2015-04-19: 10 mg via INTRAVENOUS
  Filled 2015-04-19: qty 2

## 2015-04-19 MED ORDER — DIPHENHYDRAMINE HCL 50 MG/ML IJ SOLN
25.0000 mg | Freq: Once | INTRAMUSCULAR | Status: AC
  Administered 2015-04-19: 25 mg via INTRAVENOUS
  Filled 2015-04-19: qty 1

## 2015-04-19 NOTE — Discharge Instructions (Signed)

## 2015-04-19 NOTE — ED Provider Notes (Signed)
CSN: [id number]     Arrival date & time 04/19/15  1520 History   First MD Initiated Contact with Patient 04/19/15 1546     Chief Complaint  Patient presents with  . Headache     (Consider location/radiation/quality/duration/timing/severity/associated sxs/prior Treatment) Patient is a 39 y.o. female presenting with headaches. The history is provided by the patient.  Headache Pain location:  Generalized Quality:  Dull Radiates to:  Does not radiate Onset quality:  Gradual Duration:  1 day Timing:  Constant Progression:  Unchanged Chronicity:  New Similar to prior headaches: yes   Context: activity, coughing and straining   Relieved by:  NSAIDs Worsened by:  Nothing Ineffective treatments:  Aspirin Associated symptoms: blurred vision   Associated symptoms: no fever, no visual change and no vomiting   Associated symptoms comment:  Has noticed floaters   Past Medical History  Diagnosis Date  . TIA (transient ischemic attack)   . Asthma   . Anemia   . GERD (gastroesophageal reflux disease)   . COPD (chronic obstructive pulmonary disease)   . Dyslipidemia   . Goiter   . Chronic rhinitis   . Eczema   . TIA (transient ischemic attack) 11/2010    dx at [facility]  . Hyperlipidemia    Past Surgical History  Procedure Laterality Date  . Cesarean section      x2  . Tubal ligation     Family History  Problem Relation Age of Onset  . Asthma Mother   . Allergies Mother   . Lung cancer Maternal Grandmother    History  Substance Use Topics  . Smoking status: Former Smoker    Types: Cigarettes    Quit date: 09/17/2006  . 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 Alcohol Use: Yes     Comment: occasional   OB History    No data available     Review of Systems  Constitutional: Negative for fever.  Eyes: Positive for blurred vision.  Gastrointestinal: Negative for vomiting.  Neurological: Positive for  headaches.  All other systems reviewed and are negative.     Allergies  Iodine and Iohexol  Home Medications   Prior to Admission medications   Medication Sig Start Date End Date Taking? Authorizing Provider  albuterol (PROVENTIL) (2.5 MG/3ML) 0.083% nebulizer solution Take 3 mLs (2.5 mg total) by nebulization every 4 (four) hours as needed for wheezing. 12/04/12   Gerhard Munch, MD  albuterol (VENTOLIN HFA) 108 (90 BASE) MCG/ACT inhaler Inhale 2 puffs into the lungs every 4 (four) hours as needed. For wheezing    Historical Provider, MD  beclomethasone (QVAR) 40 MCG/ACT inhaler Inhale 1 puff into the lungs 2 (two) times daily.    Historical Provider, MD  citalopram (CELEXA) 20 MG tablet Take 20 mg by mouth daily.    Historical Provider, MD  clopidogrel (PLAVIX) 75 MG tablet Take 75 mg by mouth daily.    Historical Provider, MD  fexofenadine (ALLEGRA) 60 MG tablet Take 1 tablet (60 mg total) by mouth 2 (two) times daily. 11/04/14   Hope Orlene Och, NP  predniSONE (DELTASONE) 10 MG tablet Take 2 tablets (20 mg total) by mouth daily. 11/04/14   Hope Orlene Och, NP   BP 111/82 mmHg  Pulse 81  Temp(Src) 98.1 F (36.7 C) (Oral)  Resp 16  Ht  (1.549 m)  Wt 170 lb (77.111 kg)  BMI 32.14 kg/m2  SpO2  99%  LMP 04/19/2015 Physical Exam  Constitutional: She is oriented to person, place, and time. She appears well-developed and well-nourished. No distress.  HENT:  Head: Normocephalic.  Eyes: Conjunctivae are normal.  Neck: Neck supple. No tracheal deviation present.  Cardiovascular: Normal rate and regular rhythm.   Pulmonary/Chest: Effort normal. No respiratory distress.  Abdominal: Soft. She exhibits no distension.  Neurological: She is alert and oriented to person, place, and time. She has normal strength. No cranial nerve deficit. Coordination normal. GCS eye subscore is 4. GCS verbal subscore is 5. GCS motor subscore is 6.  Skin: Skin is warm and dry.  Psychiatric: She has a normal  mood and affect.    ED Course  Procedures (including critical care time) Labs Review Labs Reviewed - No data to display  Imaging Review No results found.   EKG Interpretation None      MDM   Final diagnoses:  Episodic tension-type headache, not intractable  Floaters in visual field, unspecified laterality    39 year old female presents after her vision became blurred and she noticed some floaters last night in her eyes. She had a headache last night and took aspirin but was told by her doctors nursing service that she should not take any other NSAIDs along with this. She took a naproxen this morning and had some relief but is still having some refractory pain that is worse with coughing, bending forward, or straining. Provided supportive medications for headache. No red flag symptoms. Plan to follow up with PCP as needed and return precautions discussed for worsening or new concerning symptoms.   Lyndal Pulley, MD 04/19/15 [phone]

## 2015-04-19 NOTE — ED Notes (Signed)
Patient states that she has had a HA since last night, patient reports that she has a history of MHA and TIA's. The patient reports that this am she felt "foggy" went to work, reports that she could not stay awake. PAient has taken ASA. The patient reports that the pain is worse when she laughs or coughs. Denies any N/V

## 2015-06-19 ENCOUNTER — Encounter (HOSPITAL_BASED_OUTPATIENT_CLINIC_OR_DEPARTMENT_OTHER): Payer: Self-pay | Admitting: *Deleted

## 2015-06-19 ENCOUNTER — Emergency Department (HOSPITAL_BASED_OUTPATIENT_CLINIC_OR_DEPARTMENT_OTHER)
Admission: EM | Admit: 2015-06-19 | Discharge: 2015-06-19 | Disposition: A | Discharge status: Home / Self Care | Payer: 59 | Attending: Emergency Medicine | Admitting: Emergency Medicine

## 2015-06-19 DIAGNOSIS — Z7901 Long term (current) use of anticoagulants: Secondary | ICD-10-CM | POA: Diagnosis not present

## 2015-06-19 DIAGNOSIS — Z7951 Long term (current) use of inhaled steroids: Secondary | ICD-10-CM | POA: Insufficient documentation

## 2015-06-19 DIAGNOSIS — Z8673 Personal history of transient ischemic attack (TIA), and cerebral infarction without residual deficits: Secondary | ICD-10-CM | POA: Diagnosis not present

## 2015-06-19 DIAGNOSIS — G43109 Migraine with aura, not intractable, without status migrainosus: Secondary | ICD-10-CM

## 2015-06-19 DIAGNOSIS — Z8719 Personal history of other diseases of the digestive system: Secondary | ICD-10-CM | POA: Diagnosis not present

## 2015-06-19 DIAGNOSIS — H538 Other visual disturbances: Secondary | ICD-10-CM | POA: Diagnosis present

## 2015-06-19 DIAGNOSIS — Z7952 Long term (current) use of systemic steroids: Secondary | ICD-10-CM | POA: Diagnosis not present

## 2015-06-19 DIAGNOSIS — Z79899 Other long term (current) drug therapy: Secondary | ICD-10-CM | POA: Diagnosis not present

## 2015-06-19 DIAGNOSIS — Z862 Personal history of diseases of the blood and blood-forming organs and certain disorders involving the immune mechanism: Secondary | ICD-10-CM | POA: Insufficient documentation

## 2015-06-19 DIAGNOSIS — Z872 Personal history of diseases of the skin and subcutaneous tissue: Secondary | ICD-10-CM | POA: Insufficient documentation

## 2015-06-19 DIAGNOSIS — J449 Chronic obstructive pulmonary disease, unspecified: Secondary | ICD-10-CM | POA: Diagnosis not present

## 2015-06-19 DIAGNOSIS — Z87891 Personal history of nicotine dependence: Secondary | ICD-10-CM | POA: Diagnosis not present

## 2015-06-19 DIAGNOSIS — Z8639 Personal history of other endocrine, nutritional and metabolic disease: Secondary | ICD-10-CM | POA: Diagnosis not present

## 2015-06-19 MED ORDER — NAPROXEN 250 MG PO TABS
500.0000 mg | ORAL_TABLET | Freq: Once | ORAL | Status: AC
  Administered 2015-06-19: 500 mg via ORAL
  Filled 2015-06-19: qty 2

## 2015-06-19 NOTE — ED Notes (Signed)
Pt speaking on cell phone

## 2015-06-19 NOTE — ED Notes (Addendum)
Pt states she was at work tonight when she had a sudden onset of bilateral blurred vision. States this "fuzziness" last approx 15 minutes. States hx of TIA in the past and also a hx of migraines. Pt states she took an ASA pta. States she had a numbness to right side of her face once the vision returned and also states that the right side of head started hurting once the vision returned. Speech clear. Pt is alert and oriented times 3. Grips are equal bilateral. Pupils are 2 brisk bilateral. C/o of general weakness. Pt does state that she has not been taking her Plavix as directed.

## 2015-06-19 NOTE — Discharge Instructions (Signed)

## 2015-06-19 NOTE — ED Notes (Signed)
MD aware of pt's symptoms and arrival to room 8.

## 2015-06-19 NOTE — ED Provider Notes (Signed)
CSN: [id number]     Arrival date & time 06/19/15  0251 History   First MD Initiated Contact with Patient 06/19/15 0325     Chief Complaint  Patient presents with  . Blurred Vision     (Consider location/radiation/quality/duration/timing/severity/associated sxs/prior Treatment) HPI  This is a 39 year old female with a history of migraines and TIAs. She is on Plavix but is not compliant every day. She had the onset of blurred vision about 1:30 this morning while at work. She describes the blurred vision as looking like prisms. She had difficulty reading text. She states the blurred vision lasted about 15 minutes and resolved on its own. This was followed by paresthesias and pain in the right side of the head. She rates the pain as a 7 out of 10. It does not feel like previous migraines because there is no associated nausea or photophobia. She did take 1 aspirin prior to arrival. She had no peripheral numbness or weakness.  When shown photographs of paintings depicting migraine auras she identified several as resembling what she experienced.  Past Medical History  Diagnosis Date  . TIA (transient ischemic attack)   . Asthma   . Anemia   . GERD (gastroesophageal reflux disease)   . COPD (chronic obstructive pulmonary disease) (HCC)   . Dyslipidemia   . Goiter   . Chronic rhinitis   . Eczema   . TIA (transient ischemic attack) 11/2010    dx at [facility]  . Hyperlipidemia    Past Surgical History  Procedure Laterality Date  . Cesarean section      x2  . Tubal ligation     Family History  Problem Relation Age of Onset  . Asthma Mother   . Allergies Mother   . Lung cancer Maternal Grandmother    Social History  Substance Use Topics  . Smoking status: Former Smoker    Types: Cigarettes    Quit date: 09/17/2006  . Smokeless tobacco: None     Comment: married, works with IT sales professional - Forensic scientist  . Alcohol Use: Yes     Comment: occasional   OB History    No data  available     Review of Systems  All other systems reviewed and are negative.   Allergies  Iodine and Iohexol  Home Medications   Prior to Admission medications   Medication Sig Start Date End Date Taking? Authorizing Provider  budesonide-formoterol (SYMBICORT) 160-4.5 MCG/ACT inhaler Inhale 2 puffs into the lungs 2 (two) times daily.   Yes Historical Provider, MD  Cetirizine HCl (ZYRTEC ALLERGY PO) Take by mouth.   Yes Historical Provider, MD  albuterol (PROVENTIL) (2.5 MG/3ML) 0.083% nebulizer solution Take 3 mLs (2.5 mg total) by nebulization every 4 (four) hours as needed for wheezing. 12/04/12   Gerhard Munch, MD  albuterol (VENTOLIN HFA) 108 (90 BASE) MCG/ACT inhaler Inhale 2 puffs into the lungs every 4 (four) hours as needed. For wheezing    Historical Provider, MD  beclomethasone (QVAR) 40 MCG/ACT inhaler Inhale 1 puff into the lungs 2 (two) times daily.    Historical Provider, MD  citalopram (CELEXA) 20 MG tablet Take 20 mg by mouth daily.    Historical Provider, MD  clopidogrel (PLAVIX) 75 MG tablet Take 75 mg by mouth daily. Has not been taking as directed.    Historical Provider, MD  fexofenadine (ALLEGRA) 60 MG tablet Take 1 tablet (60 mg total) by mouth 2 (two) times daily. 11/04/14   Hope Orlene Och, NP  predniSONE (DELTASONE) 10 MG tablet Take 2 tablets (20 mg total) by mouth daily. 11/04/14   Hope Orlene Och, NP   BP 110/77 mmHg  Pulse 69  Temp(Src) 98.6 F (37 C) (Oral)  Resp 16  Ht 5' (1.524 m)  Wt 170 lb (77.111 kg)  BMI 33.20 kg/m2  SpO2 100%  LMP 06/06/2015   Physical Exam  General: Well-developed, well-nourished female in no acute distress; appearance consistent with age of record HENT: normocephalic; atraumatic Eyes: pupils equal, round and reactive to light; extraocular muscles intact Neck: supple Heart: regular rate and rhythm Lungs: clear to auscultation bilaterally Abdomen: soft; nondistended; nontender; no masses or hepatosplenomegaly; bowel sounds  present Extremities: No deformity; full range of motion; pulses normal Neurologic: Awake, alert and oriented; motor function intact in all extremities and symmetric; no facial droop; sensation intact and symmetric in face Skin: Warm and dry Psychiatric: Normal mood and affect    ED Course  Procedures (including critical care time)   MDM  4:33 AM Headache improved after naproxen 500 milligrams. History is consistent with a migraine with aura. A stroke or TIA would not be expected to cause a headache.     Paula Libra, MD 06/19/15 [phone]

## 2016-05-27 ENCOUNTER — Encounter (HOSPITAL_BASED_OUTPATIENT_CLINIC_OR_DEPARTMENT_OTHER): Payer: Self-pay | Admitting: Respiratory Therapy

## 2016-05-27 ENCOUNTER — Emergency Department (HOSPITAL_BASED_OUTPATIENT_CLINIC_OR_DEPARTMENT_OTHER): Payer: 59

## 2016-05-27 ENCOUNTER — Emergency Department (HOSPITAL_BASED_OUTPATIENT_CLINIC_OR_DEPARTMENT_OTHER)
Admission: EM | Admit: 2016-05-27 | Discharge: 2016-05-28 | Disposition: A | Discharge status: Home / Self Care | Payer: 59 | Attending: Emergency Medicine | Admitting: Emergency Medicine

## 2016-05-27 DIAGNOSIS — Z87891 Personal history of nicotine dependence: Secondary | ICD-10-CM | POA: Diagnosis not present

## 2016-05-27 DIAGNOSIS — Z79899 Other long term (current) drug therapy: Secondary | ICD-10-CM | POA: Diagnosis not present

## 2016-05-27 DIAGNOSIS — J45901 Unspecified asthma with (acute) exacerbation: Secondary | ICD-10-CM | POA: Insufficient documentation

## 2016-05-27 DIAGNOSIS — B9789 Other viral agents as the cause of diseases classified elsewhere: Secondary | ICD-10-CM

## 2016-05-27 DIAGNOSIS — J988 Other specified respiratory disorders: Secondary | ICD-10-CM | POA: Insufficient documentation

## 2016-05-27 DIAGNOSIS — J449 Chronic obstructive pulmonary disease, unspecified: Secondary | ICD-10-CM | POA: Insufficient documentation

## 2016-05-27 DIAGNOSIS — R0602 Shortness of breath: Secondary | ICD-10-CM | POA: Diagnosis present

## 2016-05-27 MED ORDER — ALBUTEROL SULFATE (2.5 MG/3ML) 0.083% IN NEBU: 5.0000 mg | INHALATION_SOLUTION | Freq: Once | RESPIRATORY_TRACT | Status: DC

## 2016-05-27 MED ORDER — DEXAMETHASONE SODIUM PHOSPHATE 10 MG/ML IJ SOLN
10.0000 mg | Freq: Once | INTRAMUSCULAR | Status: AC
  Administered 2016-05-28: 10 mg via INTRAMUSCULAR
  Filled 2016-05-27: qty 1

## 2016-05-27 MED ORDER — IPRATROPIUM-ALBUTEROL 0.5-2.5 (3) MG/3ML IN SOLN
3.0000 mL | Freq: Four times a day (QID) | RESPIRATORY_TRACT | Status: DC
  Administered 2016-05-27: 3 mL via RESPIRATORY_TRACT
  Filled 2016-05-27: qty 3

## 2016-05-27 MED ORDER — ALBUTEROL SULFATE (2.5 MG/3ML) 0.083% IN NEBU
2.5000 mg | INHALATION_SOLUTION | Freq: Once | RESPIRATORY_TRACT | Status: AC
  Administered 2016-05-27: 2.5 mg via RESPIRATORY_TRACT
  Filled 2016-05-27: qty 3

## 2016-05-27 MED ORDER — IPRATROPIUM-ALBUTEROL 0.5-2.5 (3) MG/3ML IN SOLN: 3.0000 mL | RESPIRATORY_TRACT | 0 refills | Status: DC | PRN

## 2016-05-27 NOTE — ED Triage Notes (Signed)
Patient reports shortness of breath which began Thursday.  Reports she has been using inhaler and symbicort without relief.  States she took mucinex DM yesterday and states it helped with chest tightness but states that she did breathing treatment at home prior to arrival and states she felt this made her worse.

## 2016-05-27 NOTE — ED Provider Notes (Signed)
[facility] Provider Note: Lowella Dell, MD, FACEP  CSN: [id number] MRN: [id number] ARRIVAL: 05/27/16 at 2204  By signing my name below, I, Octavia Heir, attest that this documentation has been prepared under the direction and in the presence of Paula Libra, MD.  Electronically Signed: Octavia Heir, ED Scribe. 05/27/16. 11:49 PM.  CHIEF COMPLAINT  Shortness of Breath   HISTORY OF PRESENT ILLNESS  Debbie Sexton is a 40 y.o. female who has a PMhx of asthma, COPD, DLD, HLD, and TIA presents to the Emergency Department with a respiratory infection for the past three days. She has had associated rhinorrhea and nasal congestion. Pt states she has been feeling sick for the past few days which consists of rhinorrhea, nasal congestion, scratchy throat (now resolved), dry cough and chest tightness. She has taken Mucinex DM since yesterday that has given her some relief. She notes that her shortness of breath has gotten gradually worse today. Pt has used her breathing treatment and inhaler at home to help with her shortness of breath with minimal relief. She was evaluated by respiratory therapy on arrival was found to have wheezing and decreased peak flow. She was given a DuoNeb and albuterol treatment in the ED with significant improvement in her symptoms, her wheezing and her peak flow She is a former smoker.   Past Medical History:  Diagnosis Date  . Anemia   . Asthma   . Chronic rhinitis   . COPD (chronic obstructive pulmonary disease) (HCC)   . Dyslipidemia   . Eczema   . GERD (gastroesophageal reflux disease)   . Goiter   . Hyperlipidemia   . TIA (transient ischemic attack)   . TIA (transient ischemic attack) 11/2010   dx at [facility]    Past Surgical History:  Procedure Laterality Date  . CESAREAN SECTION     x2  . TUBAL LIGATION      Family History  Problem Relation Age of Onset  . Asthma Mother   . Allergies Mother   . Lung cancer Maternal Grandmother      Social History  Substance Use Topics  . Smoking status: Former Smoker    Types: Cigarettes    Quit date: 09/17/2006  . Smokeless tobacco: Never Used     Comment: married, works with IT sales professional - Forensic scientist  . Alcohol use Yes     Comment: occasional    Prior to Admission medications   Medication Sig Start Date End Date Taking? Authorizing Provider  albuterol (PROVENTIL) (2.5 MG/3ML) 0.083% nebulizer solution Take 3 mLs (2.5 mg total) by nebulization every 4 (four) hours as needed for wheezing. 12/04/12   Gerhard Munch, MD  albuterol (VENTOLIN HFA) 108 (90 BASE) MCG/ACT inhaler Inhale 2 puffs into the lungs every 4 (four) hours as needed. For wheezing    Historical Provider, MD  budesonide-formoterol (SYMBICORT) 160-4.5 MCG/ACT inhaler Inhale 2 puffs into the lungs 2 (two) times daily.    Historical Provider, MD  Cetirizine HCl (ZYRTEC ALLERGY PO) Take by mouth.    Historical Provider, MD  citalopram (CELEXA) 20 MG tablet Take 20 mg by mouth daily.    Historical Provider, MD  clopidogrel (PLAVIX) 75 MG tablet Take 75 mg by mouth daily. Has not been taking as directed.    Historical Provider, MD  fexofenadine (ALLEGRA) 60 MG tablet Take 1 tablet (60 mg total) by mouth 2 (two) times daily. 11/04/14   Hope Orlene Och, NP  ipratropium-albuterol (DUONEB) 0.5-2.5 (3) MG/3ML SOLN Take  3 mLs by nebulization every 4 (four) hours as needed (for wheezing or shortness of breath). 05/27/16   Paula LibraJohn Mamta Rimmer, MD    Allergies Iodine and Iohexol   REVIEW OF SYSTEMS  Negative except as noted here or in the History of Present Illness.   PHYSICAL EXAMINATION  Initial Vital Signs Blood pressure 109/67, pulse 93, temperature 99.1 F (37.3 C), temperature source Oral, resp. rate 18, height 5\' 1"  (1.549 m), weight 180 lb (81.6 kg), last menstrual period 05/13/2016, SpO2 100 %.  Examination General: Well-developed, well-nourished female in no acute distress; appearance consistent with age of  record HENT: normocephalic; atraumatic, nasal congestion, uvular erythema Eyes: pupils equal, round and reactive to light; extraocular muscles intact Neck: supple Heart: regular rate and rhythm Lungs: clear to auscultation bilaterally Abdomen: soft; nondistended; nontender; no masses or hepatosplenomegaly; bowel sounds present Extremities: No deformity; full range of motion; pulses normal Neurologic: Awake, alert and oriented; motor function intact in all extremities and symmetric; no facial droop Skin: Warm and dry Psychiatric: Normal mood and affect   RESULTS  Summary of this visit's results, reviewed by myself:   EKG Interpretation  Date/Time:  Sunday May 27 2016 23:07:05 EDT Ventricular Rate:  120 PR Interval:  142 QRS Duration: 102 QT Interval:  350 QTC Calculation: 494 R Axis:   72 Text Interpretation:  Sinus tachycardia T wave abnormality, consider inferior ischemia T wave abnormality, consider anterior ischemia Abnormal ECG Rate is faster Nonspecific T wave abnormality Confirmed by Almer Bushey  MD, Jonny RuizJOHN ([id number]) on 05/27/2016 11:32:33 PM      Laboratory Studies: No results found for this or any previous visit (from the past 24 hour(s)). Imaging Studies: Dg Chest 2 View  Result Date: 05/27/2016 CLINICAL DATA:  Dyspnea, cough and chest tightness for 4 days. EXAM: CHEST  2 VIEW COMPARISON:  08/03/2014 FINDINGS: The lungs are clear. The pulmonary vasculature is normal. Heart size is normal. Hilar and mediastinal contours are unremarkable. There is no pleural effusion. IMPRESSION: No active cardiopulmonary disease. Electronically Signed   By: Ellery Plunkaniel R Mitchell M.D.   On: 05/27/2016 23:30    ED COURSE  Nursing notes and initial vitals signs, including pulse oximetry, reviewed.   PROCEDURES    ED DIAGNOSES     ICD-9-CM ICD-10-CM   1. Asthma exacerbation 493.92 J45.901   2. Viral respiratory illness 079.99 B34.9     I personally performed the services described in  this documentation, which was scribed in my presence. The recorded information has been reviewed and is accurate.    Paula LibraJohn Nehemiah Montee, MD 05/27/16 [phone]

## 2016-05-27 NOTE — Progress Notes (Signed)
Patient states that she is breathing much better after the nebulizer treatment.

## 2016-05-28 NOTE — ED Notes (Signed)
Pt given d/c instructions as per chart. Verbalizes understanding. No questions. Rx x 1 

## 2016-05-28 NOTE — ED Notes (Signed)
Productive cough, congestion since Thursday. Hx asthma.

## 2016-09-18 ENCOUNTER — Encounter (HOSPITAL_BASED_OUTPATIENT_CLINIC_OR_DEPARTMENT_OTHER): Payer: Self-pay | Admitting: Emergency Medicine

## 2016-09-18 ENCOUNTER — Emergency Department (HOSPITAL_BASED_OUTPATIENT_CLINIC_OR_DEPARTMENT_OTHER)
Admission: EM | Admit: 2016-09-18 | Discharge: 2016-09-18 | Disposition: A | Discharge status: Home / Self Care | Payer: 59 | Attending: Emergency Medicine | Admitting: Emergency Medicine

## 2016-09-18 DIAGNOSIS — J449 Chronic obstructive pulmonary disease, unspecified: Secondary | ICD-10-CM | POA: Diagnosis not present

## 2016-09-18 DIAGNOSIS — J45909 Unspecified asthma, uncomplicated: Secondary | ICD-10-CM | POA: Insufficient documentation

## 2016-09-18 DIAGNOSIS — Z79899 Other long term (current) drug therapy: Secondary | ICD-10-CM | POA: Diagnosis not present

## 2016-09-18 DIAGNOSIS — R0600 Dyspnea, unspecified: Secondary | ICD-10-CM | POA: Diagnosis present

## 2016-09-18 DIAGNOSIS — Z87891 Personal history of nicotine dependence: Secondary | ICD-10-CM | POA: Insufficient documentation

## 2016-09-18 DIAGNOSIS — R0602 Shortness of breath: Secondary | ICD-10-CM | POA: Diagnosis not present

## 2016-09-18 MED ORDER — ALBUTEROL SULFATE HFA 108 (90 BASE) MCG/ACT IN AERS
2.0000 | INHALATION_SPRAY | RESPIRATORY_TRACT | Status: DC | PRN
  Administered 2016-09-18: 2 via RESPIRATORY_TRACT
  Filled 2016-09-18: qty 6.7

## 2016-09-18 MED ORDER — IPRATROPIUM-ALBUTEROL 0.5-2.5 (3) MG/3ML IN SOLN: 3.0000 mL | RESPIRATORY_TRACT | 0 refills | Status: DC | PRN

## 2016-09-18 NOTE — ED Triage Notes (Signed)
Pt reports intermittent shob today. Pt has congestion and cough today.

## 2016-09-18 NOTE — ED Provider Notes (Signed)
[facility] Provider Note: Debbie DellJ. Lane Debbie Morford, MD, FACEP  CSN: [id number] MRN: [id number] ARRIVAL: 09/18/16 at 0239 ROOM: MH10/MH10   CHIEF COMPLAINT  URI   HISTORY OF PRESENT ILLNESS  Debbie Sexton is a 41 y.o. female with a history of asthma. She felt short of breath about midnight this morning. She used her inhaler but felt like she did not get relief from her inhaler. This made her anxious which increased her dyspnea. The dyspnea persisted despite using her inhaler again and she decided to come to the ED to be checked. While in the cool air outside her breathing improved. Respiratory therapy reports the patient looked anxious on arrival. Her dyspnea has since resolved and she is comfortable at the present time. She is having some left sided chest wall pain associated with breathing or coughing.   Past Medical History:  Diagnosis Date  . Anemia   . Asthma   . Chronic rhinitis   . COPD (chronic obstructive pulmonary disease) (HCC)   . Dyslipidemia   . Eczema   . GERD (gastroesophageal reflux disease)   . Goiter   . Hyperlipidemia   . TIA (transient ischemic attack) 11/2010   [facility]    Past Surgical History:  Procedure Laterality Date  . CESAREAN SECTION     x2  . TUBAL LIGATION      Family History  Problem Relation Age of Onset  . Asthma Mother   . Allergies Mother   . Lung cancer Maternal Grandmother     Social History  Substance Use Topics  . Smoking status: Former Smoker    Types: Cigarettes    Quit date: 09/17/2006  . 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 Alcohol use Yes     Comment: occasional    Prior to Admission medications   Medication Sig Start Date End Date Taking? Authorizing Provider  albuterol (PROVENTIL) (2.5 MG/3ML) 0.083% nebulizer solution Take 3 mLs (2.5 mg total) by nebulization every 4 (four) hours as needed for wheezing. 12/04/12   Gerhard Munchobert Lockwood, MD    albuterol (VENTOLIN HFA) 108 (90 BASE) MCG/ACT inhaler Inhale 2 puffs into the lungs every 4 (four) hours as needed. For wheezing    Historical Provider, MD  budesonide-formoterol (SYMBICORT) 160-4.5 MCG/ACT inhaler Inhale 2 puffs into the lungs 2 (two) times daily.    Historical Provider, MD  Cetirizine HCl (ZYRTEC ALLERGY PO) Take by mouth.    Historical Provider, MD  citalopram (CELEXA) 20 MG tablet Take 20 mg by mouth daily.    Historical Provider, MD  clopidogrel (PLAVIX) 75 MG tablet Take 75 mg by mouth daily. Has not been taking as directed.    Historical Provider, MD  fexofenadine (ALLEGRA) 60 MG tablet Take 1 tablet (60 mg total) by mouth 2 (two) times daily. 11/04/14   Hope Orlene OchM Neese, NP  ipratropium-albuterol (DUONEB) 0.5-2.5 (3) MG/3ML SOLN Take 3 mLs by nebulization every 4 (four) hours as needed (for wheezing or shortness of breath). 05/27/16   Paula LibraJohn Aaiden Depoy, MD    Allergies Iodine and Iohexol   REVIEW OF SYSTEMS  Negative except as noted here or in the History of Present Illness.   PHYSICAL EXAMINATION  Initial Vital Signs Blood pressure 106/71, pulse 93, temperature 97.9 F (36.6 C), temperature source Oral, resp. rate 20, height 5\' 1"  (1.549 m), weight 180 lb (81.6 kg), SpO2 97 %.  Examination General: Well-developed, well-nourished female in  no acute distress; appearance consistent with age of record HENT: normocephalic; atraumatic Eyes: pupils equal, round and reactive to light; extraocular muscles intact Neck: supple Heart: regular rate and rhythm Lungs: clear to auscultation bilaterally Abdomen: soft; nondistended; nontender; bowel sounds present Extremities: No deformity; full range of motion; pulses normal Neurologic: Awake, alert and oriented; motor function intact in all extremities and symmetric; no facial droop Skin: Warm and dry Psychiatric: Normal mood and affect   RESULTS  Summary of this visit's results, reviewed by myself:   EKG  Interpretation  Date/Time:    Ventricular Rate:    PR Interval:    QRS Duration:   QT Interval:    QTC Calculation:   R Axis:     Text Interpretation:        Laboratory Studies: No results found for this or any previous visit (from the past 24 hour(s)). Imaging Studies: No results found.  ED COURSE  Nursing notes and initial vitals signs, including pulse oximetry, reviewed.  Vitals:   09/18/16 0244 09/18/16 0245  BP:  106/71  Pulse:  93  Resp:  20  Temp:  97.9 F (36.6 C)  TempSrc:  Oral  SpO2:  97%  Weight: 180 lb (81.6 kg)   Height: 5\' 1"  (1.549 m)    The patient is requesting DuoNeb for use with her neb machine at home. She has not followed up with her pulmonologist, Dr. Sherene Sires, in some time. She was advised to follow-up with him as her medications may need adjustment.  PROCEDURES    ED DIAGNOSES     ICD-9-CM ICD-10-CM   1. Shortness of breath 786.05 R06.02        Paula Libra, MD 09/18/16 [phone]

## 2016-10-05 ENCOUNTER — Emergency Department (HOSPITAL_BASED_OUTPATIENT_CLINIC_OR_DEPARTMENT_OTHER)
Admission: EM | Admit: 2016-10-05 | Discharge: 2016-10-05 | Disposition: A | Discharge status: Home / Self Care | Payer: 59 | Attending: Emergency Medicine | Admitting: Emergency Medicine

## 2016-10-05 ENCOUNTER — Emergency Department (HOSPITAL_BASED_OUTPATIENT_CLINIC_OR_DEPARTMENT_OTHER): Payer: 59

## 2016-10-05 ENCOUNTER — Encounter (HOSPITAL_BASED_OUTPATIENT_CLINIC_OR_DEPARTMENT_OTHER): Payer: Self-pay | Admitting: *Deleted

## 2016-10-05 DIAGNOSIS — J449 Chronic obstructive pulmonary disease, unspecified: Secondary | ICD-10-CM | POA: Diagnosis not present

## 2016-10-05 DIAGNOSIS — J9801 Acute bronchospasm: Secondary | ICD-10-CM | POA: Insufficient documentation

## 2016-10-05 DIAGNOSIS — J45909 Unspecified asthma, uncomplicated: Secondary | ICD-10-CM | POA: Diagnosis not present

## 2016-10-05 DIAGNOSIS — Z87891 Personal history of nicotine dependence: Secondary | ICD-10-CM | POA: Insufficient documentation

## 2016-10-05 DIAGNOSIS — Z79899 Other long term (current) drug therapy: Secondary | ICD-10-CM | POA: Insufficient documentation

## 2016-10-05 DIAGNOSIS — K219 Gastro-esophageal reflux disease without esophagitis: Secondary | ICD-10-CM | POA: Diagnosis not present

## 2016-10-05 DIAGNOSIS — Z7982 Long term (current) use of aspirin: Secondary | ICD-10-CM | POA: Insufficient documentation

## 2016-10-05 DIAGNOSIS — R109 Unspecified abdominal pain: Secondary | ICD-10-CM | POA: Diagnosis present

## 2016-10-05 LAB — URINALYSIS, ROUTINE W REFLEX MICROSCOPIC
Bilirubin Urine: NEGATIVE
Glucose, UA: NEGATIVE mg/dL
HGB URINE DIPSTICK: NEGATIVE
Ketones, ur: NEGATIVE mg/dL
Leukocytes, UA: NEGATIVE
Nitrite: NEGATIVE
PH: 5.5 (ref 5.0–8.0)
Protein, ur: NEGATIVE mg/dL
SPECIFIC GRAVITY, URINE: 1.031 — AB (ref 1.005–1.030)

## 2016-10-05 LAB — PREGNANCY, URINE: PREG TEST UR: NEGATIVE

## 2016-10-05 MED ORDER — SUCRALFATE 1 G PO TABS: 1.0000 g | ORAL_TABLET | Freq: Three times a day (TID) | ORAL | 0 refills | Status: DC

## 2016-10-05 MED ORDER — SUCRALFATE 1 G PO TABS
1.0000 g | ORAL_TABLET | Freq: Once | ORAL | Status: AC
  Administered 2016-10-05: 1 g via ORAL
  Filled 2016-10-05: qty 1

## 2016-10-05 MED ORDER — IPRATROPIUM-ALBUTEROL 0.5-2.5 (3) MG/3ML IN SOLN: 3.0000 mL | Freq: Once | RESPIRATORY_TRACT | Status: DC

## 2016-10-05 MED ORDER — OMEPRAZOLE 40 MG PO CPDR: DELAYED_RELEASE_CAPSULE | ORAL | 0 refills | Status: DC

## 2016-10-05 MED ORDER — IPRATROPIUM-ALBUTEROL 0.5-2.5 (3) MG/3ML IN SOLN
3.0000 mL | RESPIRATORY_TRACT | Status: DC
  Administered 2016-10-05: 3 mL via RESPIRATORY_TRACT
  Filled 2016-10-05: qty 3

## 2016-10-05 NOTE — ED Provider Notes (Signed)
[facility] Provider Note: Lowella Dell, MD, FACEP  CSN: [id number] MRN: [id number] ARRIVAL: 10/05/16 at 0112 ROOM: MH01/MH01   CHIEF COMPLAINT  Back Pain   HISTORY OF PRESENT ILLNESS  Debbie Sexton is a 41 y.o. female with a history of GERD. She started taking Pepcid earlier this month and recently had her dose increased. She continues to feel symptoms of acid reflux and "gurgling" in her stomach as well as a sensation that she needs to burp but cannot. The symptoms are not severe at the present time. She is also having some central abdominal cramping. She is having no tenderness or abdominal pain that changes with movement. She denies pelvic pain, vaginal bleeding, vaginal discharge or dysuria. The GERD also exacerbates her asthma.   Her principal complaint this morning is upper back pain. She describes the pain as moderate and feeling like a "gas bubble" in her upper back. This pain does not change with movement or deep breathing. This pain began yesterday, resolved for a while and has returned.   Past Medical History:  Diagnosis Date  . Anemia   . Asthma   . Chronic rhinitis   . COPD (chronic obstructive pulmonary disease) (HCC)   . Dyslipidemia   . Eczema   . GERD (gastroesophageal reflux disease)   . Goiter   . Hyperlipidemia   . TIA (transient ischemic attack) 11/2010   dx at [facility]    Past Surgical History:  Procedure Laterality Date  . CESAREAN SECTION     x2  . TUBAL LIGATION      Family History  Problem Relation Age of Onset  . Asthma Mother   . Allergies Mother   . Lung cancer Maternal Grandmother     Social History  Substance Use Topics  . Smoking status: Former Smoker    Types: Cigarettes    Quit date: 09/17/2006  . Smokeless tobacco: Never Used     Comment: married, works with IT sales professional - Forensic scientist  . Alcohol use Yes     Comment: occasional    Prior to Admission medications   Medication Sig Start Date End Date  Taking? Authorizing Provider  aspirin 81 MG chewable tablet Chew 81 mg by mouth daily.   Yes Historical Provider, MD  montelukast (SINGULAIR) 10 MG tablet Take 10 mg by mouth at bedtime.   Yes Historical Provider, MD  albuterol (PROVENTIL) (2.5 MG/3ML) 0.083% nebulizer solution Take 3 mLs (2.5 mg total) by nebulization every 4 (four) hours as needed for wheezing. 12/04/12   Gerhard Munch, MD  albuterol (VENTOLIN HFA) 108 (90 BASE) MCG/ACT inhaler Inhale 2 puffs into the lungs every 4 (four) hours as needed. For wheezing    Historical Provider, MD  budesonide-formoterol (SYMBICORT) 160-4.5 MCG/ACT inhaler Inhale 2 puffs into the lungs 2 (two) times daily.    Historical Provider, MD  Cetirizine HCl (ZYRTEC ALLERGY PO) Take by mouth.    Historical Provider, MD  citalopram (CELEXA) 20 MG tablet Take 20 mg by mouth daily.    Historical Provider, MD  clopidogrel (PLAVIX) 75 MG tablet Take 75 mg by mouth daily. Has not been taking as directed.    Historical Provider, MD  fexofenadine (ALLEGRA) 60 MG tablet Take 1 tablet (60 mg total) by mouth 2 (two) times daily. 11/04/14   Hope Orlene Och, NP  ipratropium-albuterol (DUONEB) 0.5-2.5 (3) MG/3ML SOLN Take 3 mLs by nebulization every 4 (four) hours as needed (for wheezing or shortness of breath). 09/18/16  Paula LibraJohn Yomara Toothman, MD    Allergies Iodine and Iohexol   REVIEW OF SYSTEMS  Negative except as noted here or in the History of Present Illness.   PHYSICAL EXAMINATION  Initial Vital Signs Blood pressure 107/83, pulse 80, temperature 97.8 F (36.6 C), temperature source Oral, resp. rate 20, height 5\' 1"  (1.549 m), weight 188 lb (85.3 kg), SpO2 99 %.  Examination General: Well-developed, well-nourished female in no acute distress; appearance consistent with age of record HENT: normocephalic; atraumatic Eyes: pupils equal, round and reactive to light; extraocular muscles intact Neck: supple Heart: regular rate and rhythm; no murmurs, rubs or gallops Lungs:  clear to auscultation bilaterally Back: Nontender Abdomen: soft; nondistended; nontender; no masses or hepatosplenomegaly; bowel sounds present Extremities: No deformity; full range of motion; pulses normal Neurologic: Awake, alert and oriented; motor function intact in all extremities and symmetric; no facial droop Skin: Warm and dry Psychiatric: Normal mood and affect   RESULTS  Summary of this visit's results, reviewed by myself:   EKG Interpretation  Date/Time:  Friday October 05 2016 02:18:45 EST Ventricular Rate:  73 PR Interval:    QRS Duration: 114 QT Interval:  393 QTC Calculation: 433 R Axis:   72 Text Interpretation:  Sinus arrhythmia Incomplete right bundle branch block Rate is slower T-wave changes have resolved Confirmed by Read DriversMOLPUS  MD, Jonny RuizJOHN ([id number]) on 10/05/2016 2:28:09 AM      Laboratory Studies: Results for orders placed or performed during the hospital encounter of 10/05/16 (from the past 24 hour(s))  Urinalysis, Routine w reflex microscopic     Status: Abnormal   Collection Time: 10/05/16  2:15 AM  Result Value Ref Range   Color, Urine YELLOW YELLOW   APPearance CLEAR CLEAR   Specific Gravity, Urine 1.031 (H) 1.005 - 1.030   pH 5.5 5.0 - 8.0   Glucose, UA NEGATIVE NEGATIVE mg/dL   Hgb urine dipstick NEGATIVE NEGATIVE   Bilirubin Urine NEGATIVE NEGATIVE   Ketones, ur NEGATIVE NEGATIVE mg/dL   Protein, ur NEGATIVE NEGATIVE mg/dL   Nitrite NEGATIVE NEGATIVE   Leukocytes, UA NEGATIVE NEGATIVE  Pregnancy, urine     Status: None   Collection Time: 10/05/16  2:15 AM  Result Value Ref Range   Preg Test, Ur NEGATIVE NEGATIVE   Imaging Studies: Dg Chest 2 View  Result Date: 10/05/2016 CLINICAL DATA:  Upper back pain.  Cough and shortness of breath. EXAM: CHEST  2 VIEW COMPARISON:  05/27/2016 FINDINGS: The cardiomediastinal contours are normal. The lungs are clear. Pulmonary vasculature is normal. No consolidation, pleural effusion, or pneumothorax. No acute  osseous abnormalities are seen. IMPRESSION: No active cardiopulmonary disease. Electronically Signed   By: Rubye OaksMelanie  Ehinger M.D.   On: 10/05/2016 03:00    ED COURSE  Nursing notes and initial vitals signs, including pulse oximetry, reviewed.  Vitals:   10/05/16 0124 10/05/16 0232  BP: 107/83   Pulse: 80   Resp: 20   Temp: 97.8 F (36.6 C)   TempSrc: Oral   SpO2: 99% 99%  Weight: 188 lb (85.3 kg)   Height: 5\' 1"  (1.549 m)    3:13 AM Back discomfort significantly improved after neb treatment. Air movement has improved as well. Abdominal discomfort improved after Carafate. Will switch patient from Pepcid to a PPI and Carafate.  PROCEDURES    ED DIAGNOSES     ICD-9-CM ICD-10-CM   1. Gastroesophageal reflux disease, esophagitis presence not specified 530.81 K21.9   2. Bronchospasm 519.11 J98.01  Paula Libra, MD 10/05/16 [phone]

## 2016-10-05 NOTE — ED Triage Notes (Signed)
C/o low abd pain and cramping onset Sunday off and on and back pain onset last pm,  Denies ua symptoms

## 2016-10-23 ENCOUNTER — Other Ambulatory Visit: Payer: Self-pay | Admitting: Gastroenterology

## 2016-10-23 DIAGNOSIS — R1310 Dysphagia, unspecified: Secondary | ICD-10-CM

## 2016-10-23 DIAGNOSIS — R1319 Other dysphagia: Secondary | ICD-10-CM

## 2016-10-26 ENCOUNTER — Ambulatory Visit
Admission: RE | Admit: 2016-10-26 | Discharge: 2016-10-26 | Disposition: A | Discharge status: Home / Self Care | Payer: 59 | Source: Ambulatory Visit | Attending: Gastroenterology | Admitting: Gastroenterology

## 2016-10-26 DIAGNOSIS — R1310 Dysphagia, unspecified: Secondary | ICD-10-CM

## 2016-10-26 DIAGNOSIS — R1319 Other dysphagia: Secondary | ICD-10-CM

## 2016-11-23 ENCOUNTER — Ambulatory Visit (HOSPITAL_COMMUNITY)
Admission: RE | Admit: 2016-11-23 | Discharge: 2016-11-23 | Disposition: A | Discharge status: Home / Self Care | Payer: 59 | Source: Ambulatory Visit | Attending: Gastroenterology | Admitting: Gastroenterology

## 2016-11-23 MED ORDER — LIDOCAINE VISCOUS 2 % MT SOLN
OROMUCOSAL | Status: AC
  Filled 2016-11-23: qty 15

## 2016-12-05 ENCOUNTER — Encounter (HOSPITAL_COMMUNITY): Payer: Self-pay | Admitting: Gastroenterology

## 2016-12-05 ENCOUNTER — Encounter (HOSPITAL_COMMUNITY)
Admission: RE | Disposition: A | Discharge status: Home / Self Care | Payer: Self-pay | Source: Ambulatory Visit | Attending: Gastroenterology

## 2016-12-05 ENCOUNTER — Ambulatory Visit (HOSPITAL_COMMUNITY)
Admission: RE | Admit: 2016-12-05 | Discharge: 2016-12-05 | Disposition: A | Discharge status: Home / Self Care | Payer: 59 | Source: Ambulatory Visit | Attending: Gastroenterology | Admitting: Gastroenterology

## 2016-12-05 DIAGNOSIS — R1310 Dysphagia, unspecified: Secondary | ICD-10-CM | POA: Diagnosis not present

## 2016-12-05 DIAGNOSIS — K219 Gastro-esophageal reflux disease without esophagitis: Secondary | ICD-10-CM | POA: Diagnosis not present

## 2016-12-05 HISTORY — PX: ESOPHAGEAL MANOMETRY: SHX5429

## 2016-12-05 HISTORY — PX: PH IMPEDANCE STUDY: SHX5565

## 2016-12-05 SURGERY — MANOMETRY, ESOPHAGUS

## 2016-12-05 MED ORDER — LIDOCAINE VISCOUS 2 % MT SOLN
OROMUCOSAL | Status: AC
  Filled 2016-12-05: qty 15

## 2016-12-05 SURGICAL SUPPLY — 2 items
FACESHIELD LNG OPTICON STERILE (SAFETY) IMPLANT
GLOVE BIO SURGEON STRL SZ8 (GLOVE) ×4 IMPLANT

## 2016-12-05 NOTE — Progress Notes (Signed)
Esophageal Manometry done per protocol without complication. PH with impedance probe placed at 31.5 per protocol without complication or problem.  Pt tolerated well.  Teaching done using teachback with patient regarding study and monitor. Questions answered. Pt will return to endo unit at or after 1300 12/06/2016 to have probe removed and monitor downloaded. Reports to be sent to Dr. Malena Edmanutlaws office.

## 2017-01-23 ENCOUNTER — Other Ambulatory Visit (HOSPITAL_COMMUNITY): Payer: Self-pay | Admitting: Gastroenterology

## 2017-01-23 DIAGNOSIS — R1319 Other dysphagia: Secondary | ICD-10-CM

## 2017-01-24 ENCOUNTER — Other Ambulatory Visit (HOSPITAL_COMMUNITY): Payer: 59

## 2017-01-24 ENCOUNTER — Ambulatory Visit (HOSPITAL_COMMUNITY): Payer: 59

## 2017-02-28 ENCOUNTER — Encounter: Payer: Self-pay | Admitting: Family Medicine

## 2017-06-20 ENCOUNTER — Ambulatory Visit: Payer: 59 | Admitting: Neurology

## 2017-06-20 ENCOUNTER — Telehealth: Payer: Self-pay

## 2017-06-20 NOTE — Telephone Encounter (Signed)
Patient was a no show for her OV 06/20/17

## 2017-06-21 ENCOUNTER — Encounter: Payer: Self-pay | Admitting: Neurology

## 2017-06-26 ENCOUNTER — Ambulatory Visit: Payer: 59 | Admitting: Neurology

## 2018-04-22 ENCOUNTER — Other Ambulatory Visit: Payer: Self-pay | Admitting: Family Medicine

## 2018-04-22 DIAGNOSIS — Z1231 Encounter for screening mammogram for malignant neoplasm of breast: Secondary | ICD-10-CM

## 2018-05-16 ENCOUNTER — Ambulatory Visit: Payer: 59

## 2018-06-16 ENCOUNTER — Ambulatory Visit
Admission: RE | Admit: 2018-06-16 | Discharge: 2018-06-16 | Disposition: A | Discharge status: Home / Self Care | Payer: 59 | Source: Ambulatory Visit | Attending: Family Medicine | Admitting: Family Medicine

## 2018-06-16 DIAGNOSIS — Z1231 Encounter for screening mammogram for malignant neoplasm of breast: Secondary | ICD-10-CM

## 2019-03-12 ENCOUNTER — Other Ambulatory Visit: Payer: Self-pay | Admitting: Family Medicine

## 2019-03-12 DIAGNOSIS — R221 Localized swelling, mass and lump, neck: Secondary | ICD-10-CM

## 2019-03-18 ENCOUNTER — Other Ambulatory Visit: Payer: Self-pay | Admitting: Family Medicine

## 2019-03-18 ENCOUNTER — Ambulatory Visit
Admission: RE | Admit: 2019-03-18 | Discharge: 2019-03-18 | Disposition: A | Discharge status: Home / Self Care | Payer: 59 | Source: Ambulatory Visit | Attending: Family Medicine | Admitting: Family Medicine

## 2019-03-18 DIAGNOSIS — R221 Localized swelling, mass and lump, neck: Secondary | ICD-10-CM

## 2019-03-24 ENCOUNTER — Other Ambulatory Visit: Payer: 59

## 2019-07-03 ENCOUNTER — Ambulatory Visit (INDEPENDENT_AMBULATORY_CARE_PROVIDER_SITE_OTHER): Payer: 59

## 2019-07-03 ENCOUNTER — Ambulatory Visit (INDEPENDENT_AMBULATORY_CARE_PROVIDER_SITE_OTHER): Payer: 59 | Admitting: Internal Medicine

## 2019-07-03 ENCOUNTER — Encounter: Payer: Self-pay | Admitting: Internal Medicine

## 2019-07-03 ENCOUNTER — Other Ambulatory Visit: Payer: Self-pay

## 2019-07-03 DIAGNOSIS — R0600 Dyspnea, unspecified: Secondary | ICD-10-CM | POA: Diagnosis not present

## 2019-07-03 DIAGNOSIS — J4530 Mild persistent asthma, uncomplicated: Secondary | ICD-10-CM

## 2019-07-03 DIAGNOSIS — R0789 Other chest pain: Secondary | ICD-10-CM | POA: Diagnosis not present

## 2019-07-03 DIAGNOSIS — R0609 Other forms of dyspnea: Secondary | ICD-10-CM

## 2019-07-03 MED ORDER — BUDESONIDE-FORMOTEROL FUMARATE 160-4.5 MCG/ACT IN AERO: INHALATION_SPRAY | RESPIRATORY_TRACT | 12 refills | Status: AC

## 2019-07-03 NOTE — Patient Instructions (Signed)
Plan A = Automatic = Always=    Symbicort 160 Take 2 puffs first thing in am and then another 2 puffs about 12 hours later.   Work on inhaler technique:  relax and gently blow all the way out then take a nice smooth deep breath back in, triggering the inhaler at same time you start breathing in.  Hold for up to 5 seconds if you can. Blow out thru nose. Rinse and gargle with water when done      Plan B = Backup (to supplement plan A, not to replace it) Only use your albuterol inhaler as a rescue medication to be used if you can't catch your breath by resting or doing a relaxed purse lip breathing pattern.  - The less you use it, the better it will work when you need it. - Ok to use the inhaler up to 2 puffs  every 4 hours if you must but call for appointment if use goes up over your usual need - Don't leave home without it !!  (think of it like the spare tire for your car)    Take naprosyn with meals as needed for shoulder discomfort   Please remember to go to the  x-ray department  for your tests - we will call you with the results when they are available     Please schedule a follow up office visit in 6 weeks, call sooner if needed

## 2019-07-03 NOTE — Progress Notes (Signed)
Debbie Sexton, female    DOB: 1976-05-22,   MRN: [id number]   Brief patient profile:  43 yobf quit smoking [facility] 2008 (never cigs)  asthma as child ? Age 43 on albuterol daily for as long as she can remember until seen this Pulmonary clinic around 2009 and started on symbicort 80 2bid and did fine on just symbicort and much less need for albuterol (though still needed maybe 3 per year) and not seen in pulmonary since 2009 under the care of Debbie Sexton with pos tests to trees, pet dander and was doing great until April 2020 ? If due to lower strength symbicort (though note when last seen in pulmonary clinic 2010 was on the lower strength and doing fine as long as stayed on it )  so self referred to pulmonary clinic 07/03/2019    History of Present Illness  07/03/2019  Pulmonary/ 1st office eval/Debbie Sexton  Chief Complaint  Patient presents with  . Pulmonary Consult     Self referral. Pt states dxed with Asthma as a child. She has been on symbicort for several years and this had been working well but has had increased SOB and chest soreness since April 2020.   Dyspnea:  MMRC1 = can walk nl pace, flat grade, can't hurry or go uphills or steps s sob  - no ex cp CP ant bilateral aggravated p lift bottled bottle or 12 lb / better with musle relaxer/ naprosyn not pleuritic and not with walking   Cough: not a problem  Sleep: fine flat  SABA use: avg 2-3 per day    No obvious day to day or daytime variability or assoc excess/ purulent sputum or mucus plugs or hemoptysis or cp or chest tightness, subjective wheeze or overt sinus or hb symptoms.   Sleeping  without nocturnal  or early am exacerbation  of respiratory  c/o's or need for noct saba. Also denies any obvious fluctuation of symptoms with weather or environmental changes or other aggravating or alleviating factors except as outlined above   No unusual exposure hx or h/o childhood pna or knowledge of premature birth.  Current Allergies, Complete Past  Medical History, Past Surgical History, Family History, and Social History were reviewed in [employer] record.  ROS  The following are not active complaints unless bolded Hoarseness, sore throat, dysphagia, dental problems, itching, sneezing,  nasal congestion or discharge of excess mucus or purulent secretions, ear ache,   fever, chills, sweats, unintended wt loss or wt gain, classically pleuritic or exertional cp,  orthopnea pnd or arm/hand swelling  or leg swelling, presyncope, palpitations, abdominal pain, anorexia, nausea, vomiting, diarrhea  or change in bowel habits or change in bladder habits, change in stools or change in urine, dysuria, hematuria,  rash, arthralgias, visual complaints, headache, numbness, weakness or ataxia or problems with walking or coordination,  change in mood or  memory.           Past Medical History:  Diagnosis Date  . Anemia   . Asthma   . Chronic rhinitis   . COPD (chronic obstructive pulmonary disease) (HCC)   . Dyslipidemia   . Eczema   . GERD (gastroesophageal reflux disease)   . Goiter   . Hyperlipidemia   . TIA (transient ischemic attack) 11/2010   dx at [facility]    Outpatient Medications Prior to Visit  Medication Sig Dispense Refill  . albuterol (PROVENTIL) (2.5 MG/3ML) 0.083% nebulizer solution Take 3 mLs (2.5 mg total) by nebulization every  4 (four) hours as needed for wheezing. 75 mL 0  . albuterol (VENTOLIN HFA) 108 (90 BASE) MCG/ACT inhaler Inhale 2 puffs into the lungs every 4 (four) hours as needed. For wheezing    . aspirin 81 MG chewable tablet Chew 81 mg by mouth daily.    . budesonide-formoterol (SYMBICORT) 80-4.5 MCG/ACT inhaler Inhale 2 puffs into the lungs 2 (two) times daily.    . Cetirizine HCl (ZYRTEC ALLERGY PO) Take by mouth.    . fluticasone (FLONASE) 50 MCG/ACT nasal spray Place 2 sprays into both nostrils daily.    . Pseudoeph-Doxylamine-DM-APAP (NYQUIL PO) Take by mouth.    . sodium chloride (BRONCHO  SALINE) inhaler solution Take 1 spray by nebulization as needed.    . zolmitriptan (ZOMIG-ZMT) 2.5 MG disintegrating tablet Take 2.5 mg by mouth as needed for migraine.    .       .     0  .       .      .       No facility-administered medications prior to visit.      Objective:     BP 120/74 (BP Location: Left Arm, Cuff Size: Normal)   Pulse 81   Temp (!) 97.1 F (36.2 C) (Oral)   Ht 5\' 1"  (1.549 m)   Wt 193 lb (87.5 kg)   SpO2 100% Comment: on RA  BMI 36.47 kg/m   SpO2: 100 %(on RA)   amb pleasant bf, freq laughter   HEENT : pt wearing mask not removed for exam due to covid -19 concerns.    NECK :  without JVD/Nodes/TM/ nl carotid upstrokes bilaterally   LUNGS: no acc muscle use,  Nl contour chest which is clear to A and P bilaterally without cough on insp or exp maneuvers   CV:  RRR  no s3 or murmur or increase in P2, and no edema   ABD:  soft and nontender with nl inspiratory excursion in the supine position. No bruits or organomegaly appreciated, bowel sounds nl  MS:  Nl gait/ ext warm without deformities, calf tenderness, cyanosis or clubbing No obvious joint restrictions   SKIN: warm and dry without lesions    NEURO:  alert, approp, nl sensorium with  no motor or cerebellar deficits apparent.    CXR PA and Lateral:   07/03/2019 :    I personally reviewed images and agree with radiology impression as follows:   No active cardiopulmonary disease.        Assessment   No problem-specific Assessment & Plan notes found for this encounter.     Debbie Gully, MD 07/03/2019

## 2019-07-04 ENCOUNTER — Encounter: Payer: Self-pay | Admitting: Internal Medicine

## 2019-07-04 DIAGNOSIS — R0789 Other chest pain: Secondary | ICD-10-CM | POA: Insufficient documentation

## 2019-07-04 NOTE — Assessment & Plan Note (Addendum)
Symptoms are markedly disproportionate to objective findings and not clear to what extent this is actually a pulmonary  problem but pt does appear to have difficult to sort out respiratory symptoms of unknown origin for which  DDX  = almost all start with A and  include Adherence, Ace Inhibitors, Acid Reflux, Active Sinus Disease, Alpha 1 Antitripsin deficiency, Anxiety masquerading as Airways dz,  ABPA,  Allergy(esp in young), Aspiration (esp in elderly), Adverse effects of meds,  Active smoking or Vaping, A bunch of PE's/clot burden (a few small clots can't cause this syndrome unless there is already severe underlying pulm or vascular dz with poor reserve),  Anemia or thyroid disorder, plus two Bs  = Bronchiectasis and Beta blocker use..and one C= CHF     Adherence is always the initial "prime suspect" and is a multilayered concern that requires a "trust but verify" approach in every patient - starting with knowing how to use medications, especially inhalers, correctly, keeping up with refills and understanding the fundamental difference between maintenance and prns vs those medications only taken for a very short course and then stopped and not refilled.  - see hfa teaching - return with all meds in hand using a trust but verify approach to confirm accurate Medication  Reconciliation The principal here is that until we are certain that the  patients are doing what we've asked, it makes no sense to ask them to do more.   ? Allergy / asthma > try symb 160 2bid then regroup in 6 weeks   ? Anxiety > usually at the bottom of this list of usual suspects but should be   Included  on this pt's based on H and P    and may interfere with adherence and also interpretation of response or lack thereof to symptom management which can be quite subjective.   ? Active sinus dz/ rhinitis > unlikely s assoc cough   ? Acid (or non-acid) GERD > always difficult to exclude as up to 75% of pts in some series report no  assoc GI/ Heartburn symptoms and she does have h/o gerd symptoms prev eval by [facility] but not notes not available in epic > rec consider if not better adding  max (24h)  acid suppression and diet restrictions/ reviewed and instructions given in writing.    Will need additional w/u to include pfts off symbicort if not responding to the higher doses.

## 2019-07-04 NOTE — Assessment & Plan Note (Addendum)
DgEs 10/26/16  Normal esophagram. the 13 mm barium tablet passed promptly through the esophagus and into the stomach without obstruction or dysmotility. Despite this prompt passage into the stomach, patient felt that the tablet was stuck in her esophagus - responsive to naprosyn/ muscle relaxers reported 07/03/2019   Nothing to suggest pe or ihd here. cxr ok/ Advised if takes naprosyn should be with meals.    Total time devoted to counseling  > 50 % of initial 60 min office visit:  reviewed case with pt/ performed device teaching  using a teach back technique which also  extended face to face time for this visit (see above) discussion of options/alternatives/ personally creating written customized instructions  in presence of pt  then going over those specific  Instructions directly with the pt including how to use all of the meds but in particular covering each new medication in detail and the difference between the maintenance= "automatic" meds and the prns using an action plan format for the latter (If this problem/symptom => do that organization reading Left to right).  Please see AVS from this visit for a full list of these instructions which I personally wrote for this pt and  are unique to this visit.

## 2019-07-04 NOTE — Assessment & Plan Note (Signed)
Onset in childhood  - 07/03/2019  After extensive coaching inhaler device,  effectiveness =    75% try symb 160 2bid  Reviewed the rule of two's If your breathing worsens or you need to use your rescue inhaler more than twice weekly or wake up more than twice a month with any respiratory symptoms or require more than two rescue inhalers per year, we need to see you right away because this means we're not controlling the underlying problem (inflammation) adequately.  Rescue inhalers (albuterol) do not control inflammation and overuse can lead to unnecessary and costly consequences.  They can make you feel better temporarily but eventually they will quit working effectively much as sleep aids lead to more insomnia if used regularly.

## 2019-08-14 ENCOUNTER — Ambulatory Visit: Payer: 59 | Admitting: Internal Medicine

## 2019-08-17 ENCOUNTER — Ambulatory Visit: Payer: 59 | Admitting: Internal Medicine

## 2019-08-20 ENCOUNTER — Other Ambulatory Visit: Payer: Self-pay | Admitting: Family Medicine

## 2019-08-20 DIAGNOSIS — Z1231 Encounter for screening mammogram for malignant neoplasm of breast: Secondary | ICD-10-CM

## 2019-10-09 ENCOUNTER — Ambulatory Visit: Payer: 59

## 2019-10-23 ENCOUNTER — Other Ambulatory Visit: Payer: Self-pay

## 2019-10-23 ENCOUNTER — Encounter (HOSPITAL_BASED_OUTPATIENT_CLINIC_OR_DEPARTMENT_OTHER): Payer: Self-pay | Admitting: Emergency Medicine

## 2019-10-23 ENCOUNTER — Emergency Department (HOSPITAL_BASED_OUTPATIENT_CLINIC_OR_DEPARTMENT_OTHER)
Admission: EM | Admit: 2019-10-23 | Discharge: 2019-10-23 | Disposition: A | Discharge status: Home / Self Care | Payer: No Typology Code available for payment source | Attending: Emergency Medicine | Admitting: Emergency Medicine

## 2019-10-23 DIAGNOSIS — Z79899 Other long term (current) drug therapy: Secondary | ICD-10-CM | POA: Insufficient documentation

## 2019-10-23 DIAGNOSIS — J449 Chronic obstructive pulmonary disease, unspecified: Secondary | ICD-10-CM | POA: Insufficient documentation

## 2019-10-23 DIAGNOSIS — R072 Precordial pain: Secondary | ICD-10-CM | POA: Diagnosis present

## 2019-10-23 DIAGNOSIS — J45909 Unspecified asthma, uncomplicated: Secondary | ICD-10-CM | POA: Diagnosis not present

## 2019-10-23 DIAGNOSIS — Z7982 Long term (current) use of aspirin: Secondary | ICD-10-CM | POA: Insufficient documentation

## 2019-10-23 DIAGNOSIS — Z87891 Personal history of nicotine dependence: Secondary | ICD-10-CM | POA: Insufficient documentation

## 2019-10-23 DIAGNOSIS — K219 Gastro-esophageal reflux disease without esophagitis: Secondary | ICD-10-CM

## 2019-10-23 DIAGNOSIS — Z8673 Personal history of transient ischemic attack (TIA), and cerebral infarction without residual deficits: Secondary | ICD-10-CM | POA: Insufficient documentation

## 2019-10-23 HISTORY — DX: Other chronic pain: G89.29

## 2019-10-23 HISTORY — DX: Migraine, unspecified, not intractable, without status migrainosus: G43.909

## 2019-10-23 LAB — CBC WITH DIFFERENTIAL/PLATELET
Abs Immature Granulocytes: 0.04 10*3/uL (ref 0.00–0.07)
Basophils Absolute: 0 10*3/uL (ref 0.0–0.1)
Basophils Relative: 0 %
Eosinophils Absolute: 0.4 10*3/uL (ref 0.0–0.5)
Eosinophils Relative: 3 %
HCT: 32.6 % — ABNORMAL LOW (ref 36.0–46.0)
Hemoglobin: 9.8 g/dL — ABNORMAL LOW (ref 12.0–15.0)
Immature Granulocytes: 0 %
Lymphocytes Relative: 24 %
Lymphs Abs: 2.6 10*3/uL (ref 0.7–4.0)
MCH: 21.2 pg — ABNORMAL LOW (ref 26.0–34.0)
MCHC: 30.1 g/dL (ref 30.0–36.0)
MCV: 70.6 fL — ABNORMAL LOW (ref 80.0–100.0)
Monocytes Absolute: 0.8 10*3/uL (ref 0.1–1.0)
Monocytes Relative: 7 %
Neutro Abs: 7 10*3/uL (ref 1.7–7.7)
Neutrophils Relative %: 66 %
Platelets: 438 10*3/uL — ABNORMAL HIGH (ref 150–400)
RBC: 4.62 MIL/uL (ref 3.87–5.11)
RDW: 18 % — ABNORMAL HIGH (ref 11.5–15.5)
WBC: 10.8 10*3/uL — ABNORMAL HIGH (ref 4.0–10.5)
nRBC: 0 % (ref 0.0–0.2)

## 2019-10-23 LAB — BASIC METABOLIC PANEL
Anion gap: 8 (ref 5–15)
BUN: 13 mg/dL (ref 6–20)
CO2: 21 mmol/L — ABNORMAL LOW (ref 22–32)
Calcium: 9.1 mg/dL (ref 8.9–10.3)
Chloride: 104 mmol/L (ref 98–111)
Creatinine, Ser: 0.73 mg/dL (ref 0.44–1.00)
GFR calc Af Amer: >60 mL/min (ref >60)
GFR calc non Af Amer: >60 mL/min (ref >60)
Glucose, Bld: 115 mg/dL — ABNORMAL HIGH (ref 70–99)
Potassium: 3.5 mmol/L (ref 3.5–5.1)
Sodium: 133 mmol/L — ABNORMAL LOW (ref 135–145)

## 2019-10-23 LAB — TROPONIN I (HIGH SENSITIVITY)
Troponin I (High Sensitivity): <2 ng/L (ref <18)
Troponin I (High Sensitivity): <2 ng/L (ref <18)

## 2019-10-23 MED ORDER — ALUM & MAG HYDROXIDE-SIMETH 200-200-20 MG/5ML PO SUSP
30.0000 mL | Freq: Once | ORAL | Status: AC
  Administered 2019-10-23: 03:00:00 30 mL via ORAL
  Filled 2019-10-23: qty 30

## 2019-10-23 MED ORDER — IBUPROFEN 200 MG PO TABS
ORAL_TABLET | ORAL | Status: AC
  Filled 2019-10-23: qty 4

## 2019-10-23 MED ORDER — IBUPROFEN 800 MG PO TABS
800.0000 mg | ORAL_TABLET | Freq: Once | ORAL | Status: DC
  Filled 2019-10-23: qty 1

## 2019-10-23 MED ORDER — LIDOCAINE VISCOUS HCL 2 % MT SOLN
15.0000 mL | Freq: Once | OROMUCOSAL | Status: AC
  Administered 2019-10-23: 03:00:00 15 mL via ORAL
  Filled 2019-10-23: qty 15

## 2019-10-23 MED ORDER — OMEPRAZOLE 20 MG PO CPDR: 20.0000 mg | DELAYED_RELEASE_CAPSULE | Freq: Every day | ORAL | 0 refills | Status: AC

## 2019-10-23 NOTE — ED Provider Notes (Signed)
[facility] EMERGENCY DEPARTMENT Provider Note   CSN: [id number] Arrival date & time: 10/23/19  0215     History Chief Complaint  Patient presents with  . Chest Pain  . Back Pain  . Arm Pain    Debbie Sexton is a 44 y.o. female.  The history is provided by the patient.  Chest Pain Pain location:  Substernal area Pain quality: burning   Pain quality comment:  Into mouth Pain radiates to:  Does not radiate Pain severity:  Moderate Onset quality:  Sudden Timing:  Constant Progression:  Improving Chronicity:  Recurrent (has known reflux and took Debbie Sexton meds but got concerned because Debbie Sexton had arm pain though Debbie Sexton has known arm pain and cervical radiculopathy.  ) Context: eating   Context comment:  Jerk chicken and brussel sprouts x 2 Relieved by:  Nothing Worsened by:  Nothing Ineffective treatments:  None tried Associated symptoms: no abdominal pain, no back pain, no dizziness, no fever and no shortness of breath   Risk factors: no aortic disease        Past Medical History:  Diagnosis Date  . Anemia   . Asthma   . Chronic neck pain   . Chronic rhinitis   . COPD (chronic obstructive pulmonary disease) (HCC)   . Dyslipidemia   . Eczema   . GERD (gastroesophageal reflux disease)   . Goiter   . Hyperlipidemia   . Migraines   . TIA (transient ischemic attack) 11/2010   dx at [facility]    Patient Active Problem List   Diagnosis Date Noted  . Chest pain, musculoskeletal 07/04/2019  . DOE (dyspnea on exertion) 07/03/2019  . Anxiety 04/06/2011  . DYSLIPIDEMIA 08/17/2010  . DERMATOPHYTOSIS OF UNSPECIFIED SITE 05/02/2010  . GOITER, UNSPECIFIED 05/02/2010  . MIGRAINE HEADACHE 05/02/2010  . GERD 05/02/2010  . TOBACCO USE, QUIT 05/02/2010  . Anemia, unspecified 06/02/2008  . COPD 06/02/2008  . CHRONIC RHINITIS 04/29/2008  . Asthma, chronic, mild persistent, uncomplicated 02/02/2008    Past Surgical History:  Procedure Laterality Date  . CESAREAN SECTION       x2  . ESOPHAGEAL MANOMETRY N/A 12/05/2016   Procedure: ESOPHAGEAL MANOMETRY (EM);  Surgeon: Willis Modena, MD;  Location: [facility];  Service: Endoscopy;  Laterality: N/A;  . PH IMPEDANCE STUDY N/A 12/05/2016   Procedure: PH IMPEDANCE STUDY;  Surgeon: Willis Modena, MD;  Location: [facility];  Service: Endoscopy;  Laterality: N/A;  . TUBAL LIGATION       OB History   No obstetric history on file.     Family History  Problem Relation Age of Onset  . Asthma Mother   . Allergies Mother   . Lung cancer Maternal Grandmother   . Breast cancer Neg Hx     Social History   Tobacco Use  . Smoking status: Former Smoker    Types: Cigarettes    Quit date: 09/17/2006    Years since quitting: 13.1  . Smokeless tobacco: Never Used  . 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 Alcohol use: Yes    Comment: occasional  . Drug use: No    Home Medications Prior to Admission medications   Medication Sig Start Date End Date Taking? Authorizing Provider  famotidine (PEPCID) 20 MG tablet Take 20 mg by mouth 2 (two) times daily. 07/19/19  Yes [provider]  fluticasone (FLONASE) 50 MCG/ACT nasal spray Place 2 sprays into both nostrils daily.  Yes [provider]  methocarbamol (ROBAXIN) 500 MG tablet methocarbamol 500 mg tablet  TAKE 1 TABLET BY MOUTH ONCE DAILY AT NIGHT   Yes [provider]  naproxen (NAPROSYN) 500 MG tablet naproxen 500 mg tablet  TAKE 1 TABLET BY MOUTH EVERY 12 HOURS WITH FOOD OR MILK AS NEEDED 02/26/19  Yes [provider]  sucralfate (CARAFATE) 1 g tablet TAKE 1 TABLET BY MOUTH TWICE A DAY FOR 4 WEEKS.TAKE ONLY AS NEEDED FOR REFLUX 07/19/19  Yes [provider]  albuterol (PROVENTIL) (2.5 MG/3ML) 0.083% nebulizer solution Take 3 mLs (2.5 mg total) by nebulization every 4 (four) hours as needed for wheezing. 12/04/12   Carmin Muskrat, MD  albuterol (VENTOLIN HFA)  108 (90 BASE) MCG/ACT inhaler Inhale 2 puffs into the lungs every 4 (four) hours as needed. For wheezing    [provider]  aspirin 81 MG chewable tablet Chew 81 mg by mouth daily.    [provider]  budesonide-formoterol (SYMBICORT) 160-4.5 MCG/ACT inhaler Take 2 puffs first thing in am and then another 2 puffs about 12 hours later. 07/03/19   Tanda Rockers, MD  Cetirizine HCl (ZYRTEC ALLERGY PO) Take by mouth.    [provider]  Pseudoeph-Doxylamine-DM-APAP (NYQUIL PO) Take by mouth.    [provider]  sodium chloride (BRONCHO SALINE) inhaler solution Take 1 spray by nebulization as needed.    [provider]  zolmitriptan (ZOMIG-ZMT) 2.5 MG disintegrating tablet Take 2.5 mg by mouth as needed for migraine.    [provider]    Allergies    Iodine and Iohexol  Review of Systems   Review of Systems  Constitutional: Negative for fever.  HENT: Negative for congestion.   Eyes: Negative for visual disturbance.  Respiratory: Negative for shortness of breath.   Cardiovascular: Positive for chest pain.  Gastrointestinal: Negative for abdominal pain.  Genitourinary: Negative for difficulty urinating.  Musculoskeletal: Positive for arthralgias. Negative for back pain.  Neurological: Negative for dizziness.  Psychiatric/Behavioral: Negative for agitation.  All other systems reviewed and are negative.   Physical Exam Updated Vital Signs BP 104/65 (BP Location: Left Arm)   Pulse 64   Temp 98.6 F (37 C) (Oral)   Resp 16   Ht 5' (1.524 m)   Wt 88.5 kg   LMP 10/16/2019   SpO2 100%   BMI 38.08 kg/m   Physical Exam Vitals and nursing note reviewed.  Constitutional:      General: Debbie Sexton is not in acute distress.    Appearance: Debbie Sexton is normal weight.  HENT:     Head: Normocephalic and atraumatic.     Nose: Nose normal.  Eyes:     Conjunctiva/sclera: Conjunctivae normal.     Pupils: Pupils are equal, round, and reactive to  light.  Cardiovascular:     Rate and Rhythm: Normal rate and regular rhythm.     Pulses: Normal pulses.     Heart sounds: Normal heart sounds.  Pulmonary:     Effort: Pulmonary effort is normal.     Breath sounds: Normal breath sounds.  Abdominal:     General: Abdomen is flat. Bowel sounds are normal.     Tenderness: There is no abdominal tenderness. There is no guarding or rebound.  Musculoskeletal:        General: Normal range of motion.     Cervical back: Normal range of motion and neck supple.  Skin:    General: Skin is warm and dry.  Capillary Refill: Capillary refill takes less than 2 seconds.  Neurological:     General: No focal deficit present.     Mental Status: Debbie Sexton is alert and oriented to person, place, and time.     Sensory: No sensory deficit.  Psychiatric:        Mood and Affect: Mood normal.        Behavior: Behavior normal.     ED Results / Procedures / Treatments   Labs (all labs ordered are listed, but only abnormal results are displayed) Results for orders placed or performed during the hospital encounter of 10/23/19  CBC with Differential/Platelet  Result Value Ref Range   WBC 10.8 (H) 4.0 - 10.5 K/uL   RBC 4.62 3.87 - 5.11 MIL/uL   Hemoglobin 9.8 (L) 12.0 - 15.0 g/dL   HCT 63.7 (L) 85.8 - 85.0 %   MCV 70.6 (L) 80.0 - 100.0 fL   MCH 21.2 (L) 26.0 - 34.0 pg   MCHC 30.1 30.0 - 36.0 g/dL   RDW 27.7 (H) 41.2 - 87.8 %   Platelets 438 (H) 150 - 400 K/uL   nRBC 0.0 0.0 - 0.2 %   Neutrophils Relative % 66 %   Neutro Abs 7.0 1.7 - 7.7 K/uL   Lymphocytes Relative 24 %   Lymphs Abs 2.6 0.7 - 4.0 K/uL   Monocytes Relative 7 %   Monocytes Absolute 0.8 0.1 - 1.0 K/uL   Eosinophils Relative 3 %   Eosinophils Absolute 0.4 0.0 - 0.5 K/uL   Basophils Relative 0 %   Basophils Absolute 0.0 0.0 - 0.1 K/uL   Immature Granulocytes 0 %   Abs Immature Granulocytes 0.04 0.00 - 0.07 K/uL  Basic metabolic panel  Result Value Ref Range   Sodium 133 (L) 135 - 145  mmol/L   Potassium 3.5 3.5 - 5.1 mmol/L   Chloride 104 98 - 111 mmol/L   CO2 21 (L) 22 - 32 mmol/L   Glucose, Bld 115 (H) 70 - 99 mg/dL   BUN 13 6 - 20 mg/dL   Creatinine, Ser 6.76 0.44 - 1.00 mg/dL   Calcium 9.1 8.9 - 72.0 mg/dL   GFR calc non Af Amer >60 >60 mL/min   GFR calc Af Amer >60 >60 mL/min   Anion gap 8 5 - 15  Troponin I (High Sensitivity)  Result Value Ref Range   Troponin I (High Sensitivity) <2 <18 ng/L  Troponin I (High Sensitivity)  Result Value Ref Range   Troponin I (High Sensitivity) <2 <18 ng/L   No results found.  EKG EKG Interpretation  Date/Time:  Friday October 23 2019 02:25:22 EST Ventricular Rate:  75 PR Interval:    QRS Duration: 115 QT Interval:  401 QTC Calculation: 448 R Axis:   50 Text Interpretation: Sinus rhythm Baseline wander in lead(s) II III aVF V4 Confirmed by Nicanor Alcon, Lorn Butcher ([id number]) on 10/23/2019 3:15:17 AM   Radiology No results found.  Procedures Procedures (including critical care time)  Medications Ordered in ED Medications  ibuprofen (ADVIL) tablet 800 mg (800 mg Oral Refused 10/23/19 0345)  alum & mag hydroxide-simeth (MAALOX/MYLANTA) 200-200-20 MG/5ML suspension 30 mL (30 mLs Oral Given 10/23/19 0237)    And  lidocaine (XYLOCAINE) 2 % viscous mouth solution 15 mL (15 mLs Oral Given 10/23/19 6283)    ED Course  I have reviewed the triage vital signs and the nursing notes.  Pertinent labs & imaging results that were available during my care of the patient were  reviewed by me and considered in my medical decision making (see chart for details).  Ruled out for MI in the Ed with 2 negative troponins.  HEART score 1, low risk for MACE>  PERC negative wells 0 highly doubt PE.  Symptoms are consistent with GERD.    Debbie Sexton was evaluated in Emergency Department on 10/23/2019 for the symptoms described in the history of present illness. Debbie Sexton was evaluated in the context of the global COVID-19 pandemic, which necessitated  consideration that the patient might be at risk for infection with the SARS-CoV-2 virus that causes COVID-19. Institutional protocols and algorithms that pertain to the evaluation of patients at risk for COVID-19 are in a state of rapid change based on information released by regulatory bodies including the CDC and federal and state organizations. These policies and algorithms were followed during the patient's care in the ED.  linical Impression(s) / ED Diagnoses Final diagnoses:  None  Return for weakness, numbness, changes in vision or speech, fevers >100.4 unrelieved by medication, shortness of breath, intractable vomiting, or diarrhea, abdominal pain, Inability to tolerate liquids or food, cough, altered mental status or any concerns. No signs of systemic illness or infection. The patient is nontoxic-appearing on exam and vital signs are within normal limits.   I have reviewed the triage vital signs and the nursing notes. Pertinent labs &imaging results that were available during my care of the patient were reviewed by me and considered in my medical decision making (see chart for details).  After history, exam, and medical workup I feel the patient has been appropriately medically screened and is safe for discharge home. Pertinent diagnoses were discussed with the patient. Patient was given return precautions   Sharlisa Hollifield, MD 10/23/19 2956

## 2019-10-23 NOTE — ED Notes (Signed)
Refused motrin, doesn't swallow pills well. EDP notified

## 2019-10-23 NOTE — ED Triage Notes (Signed)
Pt reports chest pain and back pain for two hours, states she took pepcid then carafate with relief. Describes as hunger pain. Right arm pain came after chest pain. Hx GERD and neck/back pain.

## 2020-02-11 ENCOUNTER — Emergency Department (HOSPITAL_BASED_OUTPATIENT_CLINIC_OR_DEPARTMENT_OTHER)
Admission: EM | Admit: 2020-02-11 | Discharge: 2020-02-11 | Disposition: A | Discharge status: Home / Self Care | Payer: No Typology Code available for payment source | Attending: Emergency Medicine | Admitting: Emergency Medicine

## 2020-02-11 ENCOUNTER — Other Ambulatory Visit: Payer: Self-pay

## 2020-02-11 ENCOUNTER — Encounter (HOSPITAL_BASED_OUTPATIENT_CLINIC_OR_DEPARTMENT_OTHER): Payer: Self-pay

## 2020-02-11 DIAGNOSIS — J449 Chronic obstructive pulmonary disease, unspecified: Secondary | ICD-10-CM | POA: Diagnosis not present

## 2020-02-11 DIAGNOSIS — Z7982 Long term (current) use of aspirin: Secondary | ICD-10-CM | POA: Insufficient documentation

## 2020-02-11 DIAGNOSIS — J45909 Unspecified asthma, uncomplicated: Secondary | ICD-10-CM | POA: Diagnosis not present

## 2020-02-11 DIAGNOSIS — R410 Disorientation, unspecified: Secondary | ICD-10-CM | POA: Insufficient documentation

## 2020-02-11 DIAGNOSIS — Z79899 Other long term (current) drug therapy: Secondary | ICD-10-CM | POA: Diagnosis not present

## 2020-02-11 DIAGNOSIS — F410 Panic disorder [episodic paroxysmal anxiety] without agoraphobia: Secondary | ICD-10-CM | POA: Diagnosis present

## 2020-02-11 DIAGNOSIS — Z87891 Personal history of nicotine dependence: Secondary | ICD-10-CM | POA: Diagnosis not present

## 2020-02-11 NOTE — ED Provider Notes (Signed)
[facility] EMERGENCY DEPARTMENT Provider Note   CSN: [id number] Arrival date & time: 02/11/20  0756     History Chief Complaint  Patient presents with  . Panic Attack    Debbie Sexton is a 44 y.o. female.  Patient with history of TIA, chronic neck pain, COPD, high cholesterol, migraines who presents to the ED after episode of confusion.  Patient with episode of confusion.  Was just here in the ED with another patient.  States that she got kind of hot and flushed and anxious and had an episode of diarrhea.  While she was texting on the phone she felt a little confused.  No numbness or weakness.  No speech changes.  Symptoms resolved quickly.  Patient denies any chest pain or shortness of breath.  Nothing made it worse or better.  The history is provided by the patient.       Past Medical History:  Diagnosis Date  . Anemia   . Asthma   . Chronic neck pain   . Chronic rhinitis   . COPD (chronic obstructive pulmonary disease) (HCC)   . Dyslipidemia   . Eczema   . GERD (gastroesophageal reflux disease)   . Goiter   . Hyperlipidemia   . Migraines   . TIA (transient ischemic attack) 11/2010   dx at [facility]    Patient Active Problem List   Diagnosis Date Noted  . Chest pain, musculoskeletal 07/04/2019  . DOE (dyspnea on exertion) 07/03/2019  . Anxiety 04/06/2011  . DYSLIPIDEMIA 08/17/2010  . DERMATOPHYTOSIS OF UNSPECIFIED SITE 05/02/2010  . GOITER, UNSPECIFIED 05/02/2010  . MIGRAINE HEADACHE 05/02/2010  . GERD 05/02/2010  . TOBACCO USE, QUIT 05/02/2010  . Anemia, unspecified 06/02/2008  . COPD 06/02/2008  . CHRONIC RHINITIS 04/29/2008  . Asthma, chronic, mild persistent, uncomplicated 02/02/2008    Past Surgical History:  Procedure Laterality Date  . CESAREAN SECTION     x2  . ESOPHAGEAL MANOMETRY N/A 12/05/2016   Procedure: ESOPHAGEAL MANOMETRY (EM);  Surgeon: Willis Modena, MD;  Location: [facility];  Service: Endoscopy;  Laterality: N/A;  . PH  IMPEDANCE STUDY N/A 12/05/2016   Procedure: PH IMPEDANCE STUDY;  Surgeon: Willis Modena, MD;  Location: [facility];  Service: Endoscopy;  Laterality: N/A;  . TUBAL LIGATION       OB History   No obstetric history on file.     Family History  Problem Relation Age of Onset  . Asthma Mother   . Allergies Mother   . Lung cancer Maternal Grandmother   . Breast cancer Neg Hx     Social History   Tobacco Use  . Smoking status: Former Smoker    Types: Cigarettes    Quit date: 09/17/2006    Years since quitting: 13.4  . Smokeless tobacco: Never Used  . 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 Alcohol use: Yes    Comment: occasional  . Drug use: No    Home Medications Prior to Admission medications   Medication Sig Start Date End Date Taking? Authorizing Provider  albuterol (PROVENTIL) (2.5 MG/3ML) 0.083% nebulizer solution Take 3 mLs (2.5 mg total) by nebulization every 4 (four) hours as needed for wheezing. 12/04/12   Gerhard Munch, MD  albuterol (VENTOLIN HFA) 108 (90 BASE) MCG/ACT inhaler Inhale 2 puffs into the lungs every 4 (four) hours as needed. For wheezing    [provider]  aspirin 81 MG chewable tablet Chew 81 mg  by mouth daily.    [provider]  budesonide-formoterol (SYMBICORT) 160-4.5 MCG/ACT inhaler Take 2 puffs first thing in am and then another 2 puffs about 12 hours later. 07/03/19   Tanda Rockers, MD  Cetirizine HCl (ZYRTEC ALLERGY PO) Take by mouth.    [provider]  famotidine (PEPCID) 20 MG tablet Take 20 mg by mouth 2 (two) times daily. 07/19/19   [provider]  fluticasone (FLONASE) 50 MCG/ACT nasal spray Place 2 sprays into both nostrils daily.    [provider]  methocarbamol (ROBAXIN) 500 MG tablet methocarbamol 500 mg tablet  TAKE 1 TABLET BY MOUTH ONCE DAILY AT NIGHT    [provider]  naproxen (NAPROSYN) 500 MG tablet  naproxen 500 mg tablet  TAKE 1 TABLET BY MOUTH EVERY 12 HOURS WITH FOOD OR MILK AS NEEDED 02/26/19   [provider]  omeprazole (PRILOSEC) 20 MG capsule Take 1 capsule (20 mg total) by mouth daily. 10/23/19   Palumbo, April, MD  Pseudoeph-Doxylamine-DM-APAP (NYQUIL PO) Take by mouth.    [provider]  sodium chloride (BRONCHO SALINE) inhaler solution Take 1 spray by nebulization as needed.    [provider]  sucralfate (CARAFATE) 1 g tablet TAKE 1 TABLET BY MOUTH TWICE A DAY FOR 4 WEEKS.TAKE ONLY AS NEEDED FOR REFLUX 07/19/19   [provider]  zolmitriptan (ZOMIG-ZMT) 2.5 MG disintegrating tablet Take 2.5 mg by mouth as needed for migraine.    [provider]    Allergies    Iodine and Iohexol  Review of Systems   Review of Systems  Constitutional: Negative for chills and fever.  HENT: Negative for ear pain and sore throat.   Eyes: Negative for pain and visual disturbance.  Respiratory: Negative for cough and shortness of breath.   Cardiovascular: Negative for chest pain and palpitations.  Gastrointestinal: Positive for diarrhea and nausea. Negative for abdominal pain and vomiting.  Genitourinary: Negative for dysuria and hematuria.  Musculoskeletal: Negative for arthralgias and back pain.  Skin: Negative for color change and rash.  Neurological: Positive for light-headedness. Negative for dizziness, tremors, seizures, syncope, facial asymmetry, speech difficulty, weakness, numbness and headaches.       Brief confusion   All other systems reviewed and are negative.   Physical Exam Updated Vital Signs BP 103/73 (BP Location: Right Arm)   Pulse 91   Temp 98.1 F (36.7 C) (Oral)   Resp 18   Ht 5' (1.524 m)   Wt 83.9 kg   LMP 02/11/2020   SpO2 96%   BMI 36.13 kg/m   Physical Exam Vitals and nursing note reviewed.  Constitutional:      General: She is not in acute distress.    Appearance: She is well-developed. She is not  ill-appearing.  HENT:     Head: Normocephalic and atraumatic.  Eyes:     Extraocular Movements: Extraocular movements intact.     Conjunctiva/sclera: Conjunctivae normal.     Pupils: Pupils are equal, round, and reactive to light.  Cardiovascular:     Rate and Rhythm: Normal rate and regular rhythm.     Heart sounds: No murmur.  Pulmonary:     Effort: Pulmonary effort is normal. No respiratory distress.     Breath sounds: Normal breath sounds.  Abdominal:     Palpations: Abdomen is soft.     Tenderness: There is no abdominal tenderness.  Musculoskeletal:     Cervical back: Neck supple.  Skin:    General:  Skin is warm and dry.  Neurological:     General: No focal deficit present.     Mental Status: She is alert and oriented to person, place, and time.     Cranial Nerves: No cranial nerve deficit.     Sensory: No sensory deficit.     Motor: No weakness.     Coordination: Coordination normal.     Gait: Gait normal.     Comments: 5+ out of 5 strength throughout, normal sensation, no drift, normal finger-to-nose finger, no visual field deficit, normal speech     ED Results / Procedures / Treatments   Labs (all labs ordered are listed, but only abnormal results are displayed) Labs Reviewed - No data to display  EKG None  Radiology No results found.  Procedures Procedures (including critical care time)  Medications Ordered in ED Medications - No data to display  ED Course  I have reviewed the triage vital signs and the nursing notes.  Pertinent labs & imaging results that were available during my care of the patient were reviewed by me and considered in my medical decision making (see chart for details).    MDM Rules/Calculators/A&P                       Debbie Sexton is a 44 year old female with history of TIA, COPD, high cholesterol, migraines who presents to the ED after episode of confusion.  Normal vitals.  No fever.  Normal neurological exam.  Normal speech,  normal gait.  Patient had episode of feeling hot and flushed and anxious had an episode of diarrhea.  She was texting on the phone afterwards and felt a little bit confused.  However normal speech, no weakness, no numbness.  History of TIA was concerned.  Patient was just in the ED with another family member and decided to check in.  Overall have low suspicion for TIA.  Seems like possible panic versus near syncope type episode.  She has been in the emergency department all night and has not slept and believe this is likely due to poor sleep last night.  Recommend hydration at home and rest.  Given return precautions and discharged in the ED in good condition.  This chart was dictated using voice recognition software.  Despite best efforts to proofread,  errors can occur which can change the documentation meaning.    Final Clinical Impression(s) / ED Diagnoses Final diagnoses:  Confusion    Rx / DC Orders ED Discharge Orders    None       Virgina Norfolk, DO 02/11/20 [id number]

## 2020-02-11 NOTE — ED Triage Notes (Signed)
Pt arrives to ED with father for evaluation, upon time to leave with father pt states she had an episode of diarrhea, felt confused while texting, and said she was having anxiety. Pt also reports feeling shaky.

## 2020-04-24 ENCOUNTER — Encounter (HOSPITAL_BASED_OUTPATIENT_CLINIC_OR_DEPARTMENT_OTHER): Payer: Self-pay | Admitting: *Deleted

## 2020-04-24 ENCOUNTER — Emergency Department (HOSPITAL_BASED_OUTPATIENT_CLINIC_OR_DEPARTMENT_OTHER)
Admission: EM | Admit: 2020-04-24 | Discharge: 2020-04-24 | Disposition: A | Discharge status: Home / Self Care | Payer: No Typology Code available for payment source | Attending: Emergency Medicine | Admitting: Emergency Medicine

## 2020-04-24 ENCOUNTER — Other Ambulatory Visit: Payer: Self-pay

## 2020-04-24 DIAGNOSIS — Z87891 Personal history of nicotine dependence: Secondary | ICD-10-CM | POA: Diagnosis not present

## 2020-04-24 DIAGNOSIS — R253 Fasciculation: Secondary | ICD-10-CM

## 2020-04-24 DIAGNOSIS — R002 Palpitations: Secondary | ICD-10-CM | POA: Diagnosis not present

## 2020-04-24 DIAGNOSIS — J449 Chronic obstructive pulmonary disease, unspecified: Secondary | ICD-10-CM | POA: Diagnosis not present

## 2020-04-24 DIAGNOSIS — Z7951 Long term (current) use of inhaled steroids: Secondary | ICD-10-CM | POA: Insufficient documentation

## 2020-04-24 LAB — BASIC METABOLIC PANEL
Anion gap: 8 (ref 5–15)
BUN: 11 mg/dL (ref 6–20)
CO2: 25 mmol/L (ref 22–32)
Calcium: 9 mg/dL (ref 8.9–10.3)
Chloride: 103 mmol/L (ref 98–111)
Creatinine, Ser: 0.81 mg/dL (ref 0.44–1.00)
GFR calc Af Amer: >60 mL/min (ref >60)
GFR calc non Af Amer: >60 mL/min (ref >60)
Glucose, Bld: 99 mg/dL (ref 70–99)
Potassium: 4 mmol/L (ref 3.5–5.1)
Sodium: 136 mmol/L (ref 135–145)

## 2020-04-24 LAB — PREGNANCY, URINE: Preg Test, Ur: NEGATIVE

## 2020-04-24 NOTE — ED Provider Notes (Signed)
[facility] EMERGENCY DEPARTMENT Provider Note   CSN: [id number] Arrival date & time: 04/24/20  0017     History Chief Complaint  Patient presents with  . Palpitations    Debbie Sexton is a 44 y.o. female.  The history is provided by the patient.  Illness Location:  Right arm and right eyelid Quality:  Twitching of muscle Severity:  Mild Onset quality:  Gradual Timing:  Intermittent Progression:  Unchanged Chronicity:  New Context:  Takes Nyquil daily  Relieved by:  Nothing  Worsened by:  Getting anxious  Ineffective treatments:  None Associated symptoms: no abdominal pain, no chest pain, no congestion, no cough, no diarrhea, no ear pain, no fatigue, no fever, no headaches, no loss of consciousness, no myalgias, no nausea, no rash, no rhinorrhea, no shortness of breath, no sore throat, no vomiting and no wheezing   Risk factors:  Daily antihsitamine and anxiety       Past Medical History:  Diagnosis Date  . Anemia   . Asthma   . Chronic neck pain   . Chronic rhinitis   . COPD (chronic obstructive pulmonary disease) (HCC)   . Dyslipidemia   . Eczema   . GERD (gastroesophageal reflux disease)   . Goiter   . Hyperlipidemia   . Migraines   . TIA (transient ischemic attack) 11/2010   dx at [facility]    Patient Active Problem List   Diagnosis Date Noted  . Chest pain, musculoskeletal 07/04/2019  . DOE (dyspnea on exertion) 07/03/2019  . Anxiety 04/06/2011  . DYSLIPIDEMIA 08/17/2010  . DERMATOPHYTOSIS OF UNSPECIFIED SITE 05/02/2010  . GOITER, UNSPECIFIED 05/02/2010  . MIGRAINE HEADACHE 05/02/2010  . GERD 05/02/2010  . TOBACCO USE, QUIT 05/02/2010  . Anemia, unspecified 06/02/2008  . COPD 06/02/2008  . CHRONIC RHINITIS 04/29/2008  . Asthma, chronic, mild persistent, uncomplicated 02/02/2008    Past Surgical History:  Procedure Laterality Date  . CESAREAN SECTION     x2  . ESOPHAGEAL MANOMETRY N/A 12/05/2016   Procedure: ESOPHAGEAL MANOMETRY (EM);   Surgeon: Willis Modena, MD;  Location: [facility];  Service: Endoscopy;  Laterality: N/A;  . PH IMPEDANCE STUDY N/A 12/05/2016   Procedure: PH IMPEDANCE STUDY;  Surgeon: Willis Modena, MD;  Location: [facility];  Service: Endoscopy;  Laterality: N/A;  . TUBAL LIGATION       OB History   No obstetric history on file.     Family History  Problem Relation Age of Onset  . Asthma Mother   . Allergies Mother   . Lung cancer Maternal Grandmother   . Breast cancer Neg Hx     Social History   Tobacco Use  . Smoking status: Former Smoker    Types: Cigarettes    Quit date: 09/17/2006    Years since quitting: 13.6  . Smokeless tobacco: Never Used  . 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 Alcohol use: Yes    Comment: occasional  . Drug use: No    Home Medications Prior to Admission medications   Medication Sig Start Date End Date Taking? Authorizing Provider  albuterol (PROVENTIL) (2.5 MG/3ML) 0.083% nebulizer solution Take 3 mLs (2.5 mg total) by nebulization every 4 (four) hours as needed for wheezing. 12/04/12   Gerhard Munch, MD  albuterol (VENTOLIN HFA) 108 (90 BASE) MCG/ACT inhaler Inhale 2 puffs into the lungs every 4 (four) hours as needed. For wheezing    [provider]  aspirin  81 MG chewable tablet Chew 81 mg by mouth daily.    [provider]  budesonide-formoterol (SYMBICORT) 160-4.5 MCG/ACT inhaler Take 2 puffs first thing in am and then another 2 puffs about 12 hours later. 07/03/19   Nyoka Cowden, MD  Cetirizine HCl (ZYRTEC ALLERGY PO) Take by mouth.    [provider]  famotidine (PEPCID) 20 MG tablet Take 20 mg by mouth 2 (two) times daily. 07/19/19   [provider]  fluticasone (FLONASE) 50 MCG/ACT nasal spray Place 2 sprays into both nostrils daily.    [provider]  methocarbamol (ROBAXIN) 500 MG tablet methocarbamol 500 mg tablet  TAKE 1 TABLET  BY MOUTH ONCE DAILY AT NIGHT    [provider]  naproxen (NAPROSYN) 500 MG tablet naproxen 500 mg tablet  TAKE 1 TABLET BY MOUTH EVERY 12 HOURS WITH FOOD OR MILK AS NEEDED 02/26/19   [provider]  omeprazole (PRILOSEC) 20 MG capsule Take 1 capsule (20 mg total) by mouth daily. 10/23/19   Colleene Swarthout, MD  Pseudoeph-Doxylamine-DM-APAP (NYQUIL PO) Take by mouth.    [provider]  sodium chloride (BRONCHO SALINE) inhaler solution Take 1 spray by nebulization as needed.    [provider]  sucralfate (CARAFATE) 1 g tablet TAKE 1 TABLET BY MOUTH TWICE A DAY FOR 4 WEEKS.TAKE ONLY AS NEEDED FOR REFLUX 07/19/19   [provider]  zolmitriptan (ZOMIG-ZMT) 2.5 MG disintegrating tablet Take 2.5 mg by mouth as needed for migraine.    [provider]    Allergies    Iodine and Iohexol  Review of Systems   Review of Systems  Constitutional: Negative for fatigue and fever.  HENT: Negative for congestion, ear pain, rhinorrhea and sore throat.   Eyes: Negative for visual disturbance.  Respiratory: Negative for cough, shortness of breath and wheezing.   Cardiovascular: Negative for chest pain, palpitations and leg swelling.  Gastrointestinal: Negative for abdominal pain, diarrhea, nausea and vomiting.  Genitourinary: Negative for difficulty urinating.  Musculoskeletal: Negative for myalgias.  Skin: Negative for rash.  Neurological: Negative for loss of consciousness, weakness, numbness and headaches.  Psychiatric/Behavioral: Negative for agitation.  All other systems reviewed and are negative.   Physical Exam Updated Vital Signs BP 118/88 (BP Location: Right Arm)   Pulse 73   Temp 98.3 F (36.8 C) (Oral)   Resp 16   Ht 5' (1.524 m)   Wt 84.8 kg   LMP 04/18/2020   SpO2 100%   BMI 36.52 kg/m   Physical Exam Vitals and nursing note reviewed.  Constitutional:      General: She is not in acute distress.    Appearance: Normal  appearance.  HENT:     Head: Normocephalic and atraumatic.     Comments: Eyelid twitched once     Nose: Nose normal.  Eyes:     Conjunctiva/sclera: Conjunctivae normal.     Pupils: Pupils are equal, round, and reactive to light.  Cardiovascular:     Rate and Rhythm: Normal rate and regular rhythm.     Pulses: Normal pulses.     Heart sounds: Normal heart sounds.  Pulmonary:     Effort: Pulmonary effort is normal.     Breath sounds: Normal breath sounds.  Abdominal:     General: Abdomen is flat. Bowel sounds are normal.     Palpations: Abdomen is soft.     Tenderness: There is no abdominal tenderness. There is no guarding.  Musculoskeletal:  General: Normal range of motion.     Cervical back: Normal range of motion and neck supple.  Skin:    General: Skin is warm and dry.     Capillary Refill: Capillary refill takes less than 2 seconds.  Neurological:     General: No focal deficit present.     Mental Status: She is alert and oriented to person, place, and time.     Motor: No weakness.     Deep Tendon Reflexes: Reflexes normal.  Psychiatric:        Mood and Affect: Mood normal.        Behavior: Behavior normal.     ED Results / Procedures / Treatments   Labs (all labs ordered are listed, but only abnormal results are displayed) Results for orders placed or performed during the hospital encounter of 04/24/20  Basic metabolic panel  Result Value Ref Range   Sodium 136 135 - 145 mmol/L   Potassium 4.0 3.5 - 5.1 mmol/L   Chloride 103 98 - 111 mmol/L   CO2 25 22 - 32 mmol/L   Glucose, Bld 99 70 - 99 mg/dL   BUN 11 6 - 20 mg/dL   Creatinine, Ser 1.610.81 0.44 - 1.00 mg/dL   Calcium 9.0 8.9 - 09.610.3 mg/dL   GFR calc non Af Amer >60 >60 mL/min   GFR calc Af Amer >60 >60 mL/min   Anion gap 8 5 - 15  Pregnancy, urine  Result Value Ref Range   Preg Test, Ur NEGATIVE NEGATIVE   No results found.  EKG EKG Interpretation  Date/Time:  Sunday April 24 2020 02:50:04  EDT Ventricular Rate:  72 PR Interval:    QRS Duration: 112 QT Interval:  408 QTC Calculation: 447 R Axis:   72 Text Interpretation: Sinus rhythm Incomplete right bundle branch block Confirmed by Dionne Knoop ([id number]) on 04/24/2020 2:51:49 AM   Radiology No results found.  Procedures Procedures (including critical care time)  Medications Ordered in ED Medications - No data to display  ED Course  I have reviewed the triage vital signs and the nursing notes.  Pertinent labs & imaging results that were available during my care of the patient were reviewed by me and considered in my medical decision making (see chart for details).    This is a fasiculation.  Likely caused by the daily antihistamine use as this is known to cause this.  It can also be exacerbated by anxiety.  I have told her to stop the nyquil and hydrate and follow up with her PMD if it continues I recommend an outpatient EMG    Clovis RileyDamieka Dib was evaluated in Emergency Department on 04/24/2020 for the symptoms described in the history of present illness. She was evaluated in the context of the global COVID-19 pandemic, which necessitated consideration that the patient might be at risk for infection with the SARS-CoV-2 virus that causes COVID-19. Institutional protocols and algorithms that pertain to the evaluation of patients at risk for COVID-19 are in a state of rapid change based on information released by regulatory bodies including the CDC and federal and state organizations. These policies and algorithms were followed during the patient's care in the ED.  Final Clinical Impression(s) / ED Diagnoses Return for intractable cough, coughing up blood,fevers >100.4 unrelieved by medication, shortness of breath, intractable vomiting, chest pain, shortness of breath, weakness,numbness, changes in speech, facial asymmetry,abdominal pain, passing out,Inability to tolerate liquids or food, cough, altered mental status or  any concerns. No  signs of systemic illness or infection. The patient is nontoxic-appearing on exam and vital signs are within normal limits.   I have reviewed the triage vital signs and the nursing notes. Pertinent labs &imaging results that were available during my care of the patient were reviewed by me and considered in my medical decision making (see chart for details).After history, exam, and medical workup I feel the patient has beenappropriately medically screened and is safe for discharge home. Pertinent diagnoses were discussed with the patient. Patient was given return precautions.   Gelisa Tieken, MD 04/24/20 [id number]

## 2020-04-24 NOTE — ED Triage Notes (Signed)
NAD. HR 77, O2 sats 98% on room air

## 2020-04-24 NOTE — ED Triage Notes (Signed)
Pt reports right arm "jumping and twitching" today and also the same in a spot on her abdomen and under left breast. Denies pain. Pt reports she had a virtual visit today which scared her into coming to the ED
